# Patient Record
Sex: Female | Born: 1970 | Race: Black or African American | Hispanic: No | Marital: Married | Smoking: Never smoker
Health system: Southern US, Community
[De-identification: ages and names within clinical notes are randomized; demographics above are authoritative.]

## PROBLEM LIST (undated history)

## (undated) ENCOUNTER — Emergency Department (HOSPITAL_COMMUNITY): Admission: EM | Payer: Managed Care, Other (non HMO) | Source: Home / Self Care

## (undated) DIAGNOSIS — Z9889 Other specified postprocedural states: Secondary | ICD-10-CM

## (undated) DIAGNOSIS — R112 Nausea with vomiting, unspecified: Secondary | ICD-10-CM

## (undated) DIAGNOSIS — T8859XA Other complications of anesthesia, initial encounter: Secondary | ICD-10-CM

## (undated) DIAGNOSIS — D242 Benign neoplasm of left breast: Secondary | ICD-10-CM

## (undated) DIAGNOSIS — T4145XA Adverse effect of unspecified anesthetic, initial encounter: Secondary | ICD-10-CM

## (undated) HISTORY — PX: KNEE ARTHROSCOPY: SUR90

## (undated) HISTORY — PX: TUBAL LIGATION: SHX77

## (undated) HISTORY — PX: DILATION AND CURETTAGE OF UTERUS: SHX78

## (undated) HISTORY — PX: BREAST BIOPSY: SHX20

---

## 1999-07-30 ENCOUNTER — Other Ambulatory Visit: Admission: RE | Admit: 1999-07-30 | Discharge: 1999-07-30 | Payer: Self-pay | Admitting: Gynecology

## 1999-10-02 ENCOUNTER — Encounter: Payer: Self-pay | Admitting: Obstetrics and Gynecology

## 1999-10-02 ENCOUNTER — Encounter: Admission: RE | Admit: 1999-10-02 | Discharge: 1999-10-02 | Payer: Self-pay | Admitting: Obstetrics and Gynecology

## 1999-11-01 ENCOUNTER — Ambulatory Visit (HOSPITAL_BASED_OUTPATIENT_CLINIC_OR_DEPARTMENT_OTHER): Admission: RE | Admit: 1999-11-01 | Discharge: 1999-11-01 | Payer: Self-pay | Admitting: General Surgery

## 2000-03-31 ENCOUNTER — Encounter: Admission: RE | Admit: 2000-03-31 | Discharge: 2000-03-31 | Payer: Self-pay | Admitting: General Surgery

## 2000-03-31 ENCOUNTER — Encounter: Payer: Self-pay | Admitting: General Surgery

## 2002-04-15 ENCOUNTER — Encounter: Admission: RE | Admit: 2002-04-15 | Discharge: 2002-04-15 | Payer: Self-pay | Admitting: Obstetrics and Gynecology

## 2002-04-15 ENCOUNTER — Encounter: Payer: Self-pay | Admitting: Obstetrics and Gynecology

## 2002-09-06 ENCOUNTER — Other Ambulatory Visit: Admission: RE | Admit: 2002-09-06 | Discharge: 2002-09-06 | Payer: Self-pay | Admitting: Obstetrics and Gynecology

## 2004-01-05 ENCOUNTER — Other Ambulatory Visit: Admission: RE | Admit: 2004-01-05 | Discharge: 2004-01-05 | Payer: Self-pay | Admitting: Obstetrics and Gynecology

## 2008-05-22 ENCOUNTER — Encounter: Payer: Self-pay | Admitting: Emergency Medicine

## 2008-06-01 ENCOUNTER — Ambulatory Visit: Payer: Self-pay | Admitting: Cardiovascular Disease

## 2008-06-01 ENCOUNTER — Ambulatory Visit: Payer: Self-pay | Admitting: Emergency Medicine

## 2008-06-01 DIAGNOSIS — R0789 Other chest pain: Secondary | ICD-10-CM

## 2008-06-01 DIAGNOSIS — N644 Mastodynia: Secondary | ICD-10-CM

## 2008-06-06 ENCOUNTER — Encounter: Admission: RE | Admit: 2008-06-06 | Discharge: 2008-06-06 | Payer: Self-pay | Admitting: Obstetrics and Gynecology

## 2008-06-07 ENCOUNTER — Telehealth (INDEPENDENT_AMBULATORY_CARE_PROVIDER_SITE_OTHER): Payer: Self-pay | Admitting: *Deleted

## 2008-07-04 ENCOUNTER — Ambulatory Visit: Payer: Self-pay | Admitting: Emergency Medicine

## 2008-07-11 ENCOUNTER — Ambulatory Visit: Payer: Self-pay | Admitting: Emergency Medicine

## 2008-08-02 ENCOUNTER — Encounter: Payer: Self-pay | Admitting: Emergency Medicine

## 2009-11-08 ENCOUNTER — Telehealth (INDEPENDENT_AMBULATORY_CARE_PROVIDER_SITE_OTHER): Payer: Self-pay | Admitting: *Deleted

## 2010-02-18 ENCOUNTER — Encounter: Payer: Self-pay | Admitting: Obstetrics and Gynecology

## 2010-02-27 NOTE — Progress Notes (Signed)
  Phone Note Other Incoming   Request: Send information Summary of Call: Request for records received from [facility]. Request forwarded to [facility] for all Pulmonary records to be copied and sent.

## 2010-06-15 NOTE — H&P (Signed)
[place]  Patient:    Bianca Bass, Bianca Bass                      MRN: [id number] Adm. Date:  [id number] Disc. Date: [id number] Attending:  Cherylynn Ridges Dictator:   Jimmye Norman, M.D.                         History and Physical  CHIEF COMPLAINT:  The patient is a 40 year old female with a right breast mass.  HISTORY OF PRESENT ILLNESS:  She came to see me on October 23, 1999, with a breast mass noted by herself and also by her primary care physician and OB/GYN who on examination saw that laterally in her right breast there was a mobile mass.  Upon evaluation by ultrasound, this was thought to be likely a fibroadenoma and a surgical consultation was obtained.  PAST MEDICAL HISTORY:  Her past medical history is unremarkable.  She has had a previous breast biopsy x 4 all of which have been benign, and she has had a breast biopsy on the right side before.  She has no history of breast cancer herself.  She has no history of breast cancer in her family.  She started her menstrual cycle at age 39 and is currently having normal periods.  She had a tubal ligation in 1995.  MEDICATIONS:  She takes no medications.  ALLERGIES:  She has no known drug allergies.  PHYSICAL EXAMINATION:  GENERAL:  She is otherwise healthy.  BREAST EXAMINATION:  She has normal contour of the breasts without inversions or dimpling.  She has obvious scars from her previous biopsies.  She has no nipple inversion or drainage.  She has slight enlargement of the left axillary fat pad, a possible lymph node, but this does not feel pathologic.  There is no palpable node on the right in the axilla.  In the outer quadrant of the right breast at approximately 8-9 oclock position there is a very mobile easily palpable mass of approximately 1.5 cm in size which is nontender and it feels like a fibroadenoma.  She has small nodularity throughout both breasts, but none is prominent as the  one mentioned previously on the right side. There is nothing alarming about this mass, it is freely mobile and smooth and very likely it is a fibroadenoma, however, to show this definitely an open biopsy has been planned.  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  PLAN:  My plan is to perform an open breast biopsy and this will be done as an outpatient. DD:  11/01/99 TD:  11/01/99 Job: [id number]

## 2010-06-15 NOTE — Op Note (Signed)
[place]  Patient:    Bianca Bass, Bianca Bass                      MRN: [id number] Proc. Date: 11/01/99 Adm. Date:  [id number] Disc. Date: [id number] Attending:  Cherylynn Ridges                           Operative Report  PREOPERATIVE DIAGNOSIS:  Fibroadenoma of the right breast in the lateral position.  POSTOPERATIVE DIAGNOSIS:  Fibroadenoma of the right breast in the lateral position.  OPERATION:  Right breast biopsy.  SURGEON:  Jimmye Norman, M.D.  ASSISTANT:  None.  ANESTHESIA:  Monitored anesthesia care with 1% Xylocaine local and 0.25% Marcaine local anesthetic.  COMPLICATIONS:  None.  CONDITION:  Stable.  FINDINGS:  A 2.0 cm bilobulated fibroadenoma of the right breast near the chest wall.  DESCRIPTION OF PROCEDURE:  The patient was taken to the operating room and placed on the table in supine position.  After an adequate general anesthetic was administered, she was prepped and draped in the usual sterile manner exposing the right breast.  After receiving an appropriate amount of sedation, the area directly overlying the palpable fibroadenoma was anesthetized using 1% Xylocaine without epinephrine.  We used a #15 blade to incise into the skin and subcutaneous tissue.  We palpated the mass and were able to retract some of the subcutaneous tissue up to the skin and excised that, sending it as a separate specimen on top of the fibroadenoma.  The fibroadenoma seemed to be deeper into the breast tissue underneath a separate fascia layer.  This was dissected out and sent as a separate specimen with minimal difficulty.  Once the fibroadenoma was removed, adequate hemostasis was obtained using electrocautery.  We then closed in two layers, a deep 4-0 Vicryl subcutaneous layer, and the skin closed using a running subcuticular stitch of 5-0 Vicryl. Marcaine 0.25% with epinephrine was injected to the subcutaneous tissue and the breast substance  itself and also allowed to fill the fibroadenoma cavity. All needle counts, sponge counts, and instrument counts were correct.  Sterile dressing was applied to the wound. DD:  11/01/99 TD:  11/02/99 Job: [id number]

## 2010-09-24 ENCOUNTER — Other Ambulatory Visit: Payer: Self-pay | Admitting: Obstetrics and Gynecology

## 2010-09-24 DIAGNOSIS — N644 Mastodynia: Secondary | ICD-10-CM

## 2010-09-24 DIAGNOSIS — N63 Unspecified lump in unspecified breast: Secondary | ICD-10-CM

## 2010-10-08 ENCOUNTER — Other Ambulatory Visit: Payer: Self-pay | Admitting: Obstetrics and Gynecology

## 2010-10-08 ENCOUNTER — Ambulatory Visit
Admission: RE | Admit: 2010-10-08 | Discharge: 2010-10-08 | Disposition: A | Discharge status: Home / Self Care | Payer: PRIVATE HEALTH INSURANCE | Source: Ambulatory Visit | Attending: Obstetrics and Gynecology | Admitting: Obstetrics and Gynecology

## 2010-10-08 DIAGNOSIS — N63 Unspecified lump in unspecified breast: Secondary | ICD-10-CM

## 2010-10-08 DIAGNOSIS — N644 Mastodynia: Secondary | ICD-10-CM

## 2011-06-03 ENCOUNTER — Other Ambulatory Visit: Payer: Self-pay | Admitting: Obstetrics and Gynecology

## 2011-07-29 ENCOUNTER — Other Ambulatory Visit: Payer: Self-pay | Admitting: Family Medicine

## 2011-07-29 DIAGNOSIS — N644 Mastodynia: Secondary | ICD-10-CM

## 2011-08-14 ENCOUNTER — Ambulatory Visit
Admission: RE | Admit: 2011-08-14 | Discharge: 2011-08-14 | Disposition: A | Discharge status: Home / Self Care | Payer: PRIVATE HEALTH INSURANCE | Source: Ambulatory Visit | Attending: Family Medicine | Admitting: Family Medicine

## 2011-08-14 DIAGNOSIS — N644 Mastodynia: Secondary | ICD-10-CM

## 2014-01-28 HISTORY — PX: ENDOMETRIAL ABLATION: SHX621

## 2014-12-02 ENCOUNTER — Other Ambulatory Visit: Payer: Self-pay

## 2014-12-02 DIAGNOSIS — Z1231 Encounter for screening mammogram for malignant neoplasm of breast: Secondary | ICD-10-CM

## 2015-01-16 ENCOUNTER — Ambulatory Visit: Payer: PRIVATE HEALTH INSURANCE

## 2016-02-23 ENCOUNTER — Other Ambulatory Visit: Payer: Self-pay | Admitting: Obstetrics and Gynecology

## 2016-02-23 DIAGNOSIS — Z1231 Encounter for screening mammogram for malignant neoplasm of breast: Secondary | ICD-10-CM

## 2016-03-04 ENCOUNTER — Ambulatory Visit
Admission: RE | Admit: 2016-03-04 | Discharge: 2016-03-04 | Disposition: A | Discharge status: Home / Self Care | Payer: Managed Care, Other (non HMO) | Source: Ambulatory Visit | Attending: Obstetrics and Gynecology | Admitting: Obstetrics and Gynecology

## 2016-03-04 DIAGNOSIS — Z1231 Encounter for screening mammogram for malignant neoplasm of breast: Secondary | ICD-10-CM

## 2016-03-06 ENCOUNTER — Other Ambulatory Visit: Payer: Self-pay | Admitting: Obstetrics and Gynecology

## 2016-03-06 DIAGNOSIS — R928 Other abnormal and inconclusive findings on diagnostic imaging of breast: Secondary | ICD-10-CM

## 2016-03-11 ENCOUNTER — Ambulatory Visit
Admission: RE | Admit: 2016-03-11 | Discharge: 2016-03-11 | Disposition: A | Discharge status: Home / Self Care | Payer: Managed Care, Other (non HMO) | Source: Ambulatory Visit | Attending: Obstetrics and Gynecology | Admitting: Obstetrics and Gynecology

## 2016-03-11 DIAGNOSIS — R928 Other abnormal and inconclusive findings on diagnostic imaging of breast: Secondary | ICD-10-CM

## 2016-03-15 ENCOUNTER — Other Ambulatory Visit: Payer: Self-pay | Admitting: General Surgery

## 2016-03-15 DIAGNOSIS — D242 Benign neoplasm of left breast: Secondary | ICD-10-CM

## 2016-05-01 ENCOUNTER — Other Ambulatory Visit: Payer: Self-pay | Admitting: General Surgery

## 2016-05-01 DIAGNOSIS — D242 Benign neoplasm of left breast: Secondary | ICD-10-CM

## 2016-05-08 ENCOUNTER — Encounter (HOSPITAL_BASED_OUTPATIENT_CLINIC_OR_DEPARTMENT_OTHER): Payer: Self-pay | Admitting: *Deleted

## 2016-05-13 ENCOUNTER — Ambulatory Visit
Admission: RE | Admit: 2016-05-13 | Discharge: 2016-05-13 | Disposition: A | Discharge status: Home / Self Care | Payer: Managed Care, Other (non HMO) | Source: Ambulatory Visit | Attending: General Surgery | Admitting: General Surgery

## 2016-05-13 ENCOUNTER — Other Ambulatory Visit: Payer: Self-pay | Admitting: General Surgery

## 2016-05-13 DIAGNOSIS — D242 Benign neoplasm of left breast: Secondary | ICD-10-CM

## 2016-05-13 NOTE — Progress Notes (Signed)
Boost drink given with instructions to complete by 0730, pt verbalized understanding. 

## 2016-05-16 NOTE — H&P (Signed)
[place] Location: [facility] Surgery Patient #: [id number] DOB: 08-06-1970 Married / Language: English / Race: Black or African American Female       History of Present Illness       This is a pleasant 46 year old African American female, referred by Dr. Lovey Newcomer of the procedure and agrees were evaluation of an enlarging fibroadenoma of the left breast upper outer quadrant. She is healthy and does not have a PCP      She has had fibroadenomas excised in the past. 3 on the left side and one on the right. In 2013 she had image guided biopsy of a small fibroadenoma in the upper outer quadrant of the left breast. Marker clip was placed. Recent screening mammogram shows that this has enlarged from 1.1-1.4 cm. Lobular mass. 1 o'clock position left breast. 6 cm from nipple. She is not sure she can feel this but says it is a little bit tender there. Also noted were 3 stable benign right breast masses which are hypoechoic.  She is inclined to have the left breast mass excised as well.      Past history significant for right breast biopsy 1 and left breast biopsy 3 since she was a teenager. She says she thinks all these were fibroadenomas. Otherwise healthy other than BMI 36. Has no PCP Family history is negative for breast or ovarian cancer. Mother living has multiple sclerosis. Father living has diabetes and hypertension. Social history reveals she lives in [place]. Is married with 1 child. Works as a Theme park manager. Denies alcohol or tobacco.      We discussed this for a long time. She is inclined to have this excised as well. I told her that the risk of this being cancer is pretty low and it is probably just a slowly enlarging fibroadenoma. She'll be scheduled for left breast lumpectomy with radioactive seed localization I discussed the indications, details, techniques, and numerous risk of the surgery with her. She is aware of the risk of bleeding, infection,  cosmetic deformity, nerve damage and chronic pain, reoperation of cancer, and other unforeseen problems. She understands these issues well. All of her questions were answered. She agrees with this plan.      I printed out some information about fibroadenomas from [facility] and gave it to her   Past Surgical History  Breast Mass; Local Excision  Bilateral. Knee Surgery  Bilateral.  Diagnostic Studies History  Colonoscopy  never Mammogram  within last year Pap Smear  1-5 years ago  Allergies  No Known Allergies 03/15/2016  Medication History  No Current Medications Medications Reconciled  Social History  Caffeine use  Carbonated beverages, Coffee.  Family History  Diabetes Mellitus  Father. Hypertension  Father.  Pregnancy / Birth History  Age at menarche  51 years. Gravida  2 Irregular periods  Maternal age  75-25 Para  60  Other Problems  General anesthesia - complications     Review of Systems  General Not Present- Appetite Loss, Chills, Fatigue, Fever, Night Sweats, Weight Gain and Weight Loss. Skin Not Present- Change in Wart/Mole, Dryness, Hives, Jaundice, New Lesions, Non-Healing Wounds, Rash and Ulcer. HEENT Not Present- Earache, Hearing Loss, Hoarseness, Nose Bleed, Oral Ulcers, Ringing in the Ears, Seasonal Allergies, Sinus Pain, Sore Throat, Visual Disturbances, Wears glasses/contact lenses and Yellow Eyes. Breast Present- Breast Mass. Not Present- Breast Pain, Nipple Discharge and Skin Changes. Cardiovascular Not Present- Chest Pain, Difficulty Breathing Lying Down, Leg Cramps, Palpitations, Rapid Heart Rate,  Shortness of Breath and Swelling of Extremities. Gastrointestinal Not Present- Abdominal Pain, Bloating, Bloody Stool, Change in Bowel Habits, Chronic diarrhea, Constipation, Difficulty Swallowing, Excessive gas, Gets full quickly at meals, Hemorrhoids, Indigestion, Nausea, Rectal Pain and Vomiting. Female Genitourinary Not Present-  Frequency, Nocturia, Painful Urination, Pelvic Pain and Urgency. Musculoskeletal Not Present- Back Pain, Joint Pain, Joint Stiffness, Muscle Pain, Muscle Weakness and Swelling of Extremities. Neurological Not Present- Decreased Memory, Fainting, Headaches, Numbness, Seizures, Tingling, Tremor, Trouble walking and Weakness. Psychiatric Not Present- Anxiety, Bipolar, Change in Sleep Pattern, Depression, Fearful and Frequent crying. Endocrine Not Present- Cold Intolerance, Excessive Hunger, Hair Changes, Heat Intolerance, Hot flashes and New Diabetes.  Vitals  Weight: 241 lb Height: 68.5in Body Surface Area: 2.22 m Body Mass Index: 36.11 kg/m  Temp.: 98.87F  Pulse: 93 (Regular)  BP: 118/80 (Sitting, Left Arm, Standard)     Physical Exam  General Mental Status-Alert. General Appearance-Consistent with stated age. Hydration-Well hydrated. Voice-Normal. Note: Very pleasant. Cooperative. Mental status normal. BMI 36   Head and Neck Head-normocephalic, atraumatic with no lesions or palpable masses. Trachea-midline. Thyroid Gland Characteristics - normal size and consistency.  Eye Eyeball - Bilateral-Extraocular movements intact. Sclera/Conjunctiva - Bilateral-No scleral icterus.  Chest and Lung Exam Chest and lung exam reveals -quiet, even and easy respiratory effort with no use of accessory muscles and on auscultation, normal breath sounds, no adventitious sounds and normal vocal resonance. Inspection Chest Wall - Normal. Back - normal.  Breast Note: Breasts are large and symmetrical. There is a curvilinear scar in the right breast at the 9 o'clock position well healed. There are curvilinear biopsy scars in the left breast at the 7 o'clock position, medial circumareolar, and upper outer quadrant. I may feel a little bit of thickening at the 1 o'clock position but not a discrete mass. There is no axillary adenopathy on either side. Skin is otherwise  healthy.   Cardiovascular Cardiovascular examination reveals -normal heart sounds, regular rate and rhythm with no murmurs and normal pedal pulses bilaterally.  Abdomen Inspection Inspection of the abdomen reveals - No Hernias. Skin - Scar - no surgical scars. Palpation/Percussion Palpation and Percussion of the abdomen reveal - Soft, Non Tender, No Rebound tenderness, No Rigidity (guarding) and No hepatosplenomegaly. Auscultation Auscultation of the abdomen reveals - Bowel sounds normal.  Neurologic Neurologic evaluation reveals -alert and oriented x 3 with no impairment of recent or remote memory. Mental Status-Normal.  Musculoskeletal Normal Exam - Left-Upper Extremity Strength Normal and Lower Extremity Strength Normal. Normal Exam - Right-Upper Extremity Strength Normal and Lower Extremity Strength Normal.  Lymphatic Head & Neck  General Head & Neck Lymphatics: Bilateral - Description - Normal. Axillary  General Axillary Region: Bilateral - Description - Normal. Tenderness - Non Tender. Femoral & Inguinal  Generalized Femoral & Inguinal Lymphatics: Bilateral - Description - Normal. Tenderness - Non Tender.    Assessment & Plan  BREAST FIBROADENOMA, LEFT (D24.2)    Your recent imaging studies showed that a previously biopsied fibroadenoma in the upper outer quadrant of the left breast has enlarged somewhat This is most likely not cancer However, because of the enlargement, the recommended standard of care is conservative excision We have discussed the indications, techniques, and risks of this surgery in detail You have agreed to have this done electively in the next few weeks  you will be scheduled for left breast lumpectomy with radioactive seed localization  BMI 36.0-36.9,ADULT (Z68.36)    Edsel Petrin. Dalbert Batman, M.D., [facility] General and  Minimally invasive Surgery Breast and Colorectal Surgery Office:    [phone] Pager:   [phone]

## 2016-05-17 ENCOUNTER — Ambulatory Visit (HOSPITAL_BASED_OUTPATIENT_CLINIC_OR_DEPARTMENT_OTHER): Payer: Managed Care, Other (non HMO) | Admitting: Anesthesiology

## 2016-05-17 ENCOUNTER — Encounter (HOSPITAL_BASED_OUTPATIENT_CLINIC_OR_DEPARTMENT_OTHER): Payer: Self-pay | Admitting: *Deleted

## 2016-05-17 ENCOUNTER — Ambulatory Visit
Admission: RE | Admit: 2016-05-17 | Discharge: 2016-05-17 | Disposition: A | Discharge status: Home / Self Care | Payer: Managed Care, Other (non HMO) | Source: Ambulatory Visit | Attending: General Surgery | Admitting: General Surgery

## 2016-05-17 ENCOUNTER — Encounter (HOSPITAL_BASED_OUTPATIENT_CLINIC_OR_DEPARTMENT_OTHER)
Admission: RE | Disposition: A | Discharge status: Home / Self Care | Payer: Self-pay | Source: Ambulatory Visit | Attending: General Surgery

## 2016-05-17 ENCOUNTER — Ambulatory Visit (HOSPITAL_BASED_OUTPATIENT_CLINIC_OR_DEPARTMENT_OTHER)
Admission: RE | Admit: 2016-05-17 | Discharge: 2016-05-17 | Disposition: A | Discharge status: Home / Self Care | Payer: Managed Care, Other (non HMO) | Source: Ambulatory Visit | Attending: General Surgery | Admitting: General Surgery

## 2016-05-17 DIAGNOSIS — D242 Benign neoplasm of left breast: Secondary | ICD-10-CM | POA: Diagnosis not present

## 2016-05-17 DIAGNOSIS — Z6837 Body mass index (BMI) 37.0-37.9, adult: Secondary | ICD-10-CM | POA: Insufficient documentation

## 2016-05-17 DIAGNOSIS — N6320 Unspecified lump in the left breast, unspecified quadrant: Secondary | ICD-10-CM | POA: Diagnosis present

## 2016-05-17 DIAGNOSIS — E669 Obesity, unspecified: Secondary | ICD-10-CM | POA: Insufficient documentation

## 2016-05-17 HISTORY — DX: Benign neoplasm of left breast: D24.2

## 2016-05-17 HISTORY — PX: BREAST LUMPECTOMY WITH RADIOACTIVE SEED LOCALIZATION: SHX6424

## 2016-05-17 HISTORY — DX: Other specified postprocedural states: Z98.890

## 2016-05-17 HISTORY — DX: Adverse effect of unspecified anesthetic, initial encounter: T41.45XA

## 2016-05-17 HISTORY — DX: Nausea with vomiting, unspecified: R11.2

## 2016-05-17 HISTORY — DX: Other complications of anesthesia, initial encounter: T88.59XA

## 2016-05-17 HISTORY — PX: BREAST EXCISIONAL BIOPSY: SUR124

## 2016-05-17 SURGERY — BREAST LUMPECTOMY WITH RADIOACTIVE SEED LOCALIZATION
Anesthesia: General | Site: Breast | Laterality: Left

## 2016-05-17 MED ORDER — 0.9 % SODIUM CHLORIDE (POUR BTL) OPTIME
TOPICAL | Status: DC | PRN
  Administered 2016-05-17: 1000 mL

## 2016-05-17 MED ORDER — SODIUM CHLORIDE 0.9 % IJ SOLN
INTRAMUSCULAR | Status: AC
  Filled 2016-05-17: qty 10

## 2016-05-17 MED ORDER — CELECOXIB 200 MG PO CAPS
ORAL_CAPSULE | ORAL | Status: AC
  Filled 2016-05-17: qty 2

## 2016-05-17 MED ORDER — FENTANYL CITRATE (PF) 100 MCG/2ML IJ SOLN
INTRAMUSCULAR | Status: AC
Start: 2016-05-17 — End: 2016-05-17
  Filled 2016-05-17: qty 2

## 2016-05-17 MED ORDER — BUPIVACAINE HCL (PF) 0.5 % IJ SOLN
INTRAMUSCULAR | Status: DC | PRN
  Administered 2016-05-17: 10 mL

## 2016-05-17 MED ORDER — SCOPOLAMINE 1 MG/3DAYS TD PT72
MEDICATED_PATCH | TRANSDERMAL | Status: AC
  Filled 2016-05-17: qty 1

## 2016-05-17 MED ORDER — CHLORHEXIDINE GLUCONATE CLOTH 2 % EX PADS: 6.0000 | MEDICATED_PAD | Freq: Once | CUTANEOUS | Status: DC

## 2016-05-17 MED ORDER — PHENYLEPHRINE 40 MCG/ML (10ML) SYRINGE FOR IV PUSH (FOR BLOOD PRESSURE SUPPORT)
PREFILLED_SYRINGE | INTRAVENOUS | Status: AC
  Filled 2016-05-17: qty 20

## 2016-05-17 MED ORDER — PROPOFOL 500 MG/50ML IV EMUL
INTRAVENOUS | Status: DC | PRN
  Administered 2016-05-17: 150 mL via INTRAVENOUS

## 2016-05-17 MED ORDER — GABAPENTIN 300 MG PO CAPS
ORAL_CAPSULE | ORAL | Status: AC
  Filled 2016-05-17: qty 1

## 2016-05-17 MED ORDER — GABAPENTIN 300 MG PO CAPS
300.0000 mg | ORAL_CAPSULE | ORAL | Status: AC
  Administered 2016-05-17: 300 mg via ORAL

## 2016-05-17 MED ORDER — METHYLENE BLUE 0.5 % INJ SOLN
INTRAVENOUS | Status: AC
  Filled 2016-05-17: qty 10

## 2016-05-17 MED ORDER — MIDAZOLAM HCL 2 MG/2ML IJ SOLN
INTRAMUSCULAR | Status: AC
  Filled 2016-05-17: qty 2

## 2016-05-17 MED ORDER — FENTANYL CITRATE (PF) 100 MCG/2ML IJ SOLN
50.0000 ug | INTRAMUSCULAR | Status: DC | PRN
  Administered 2016-05-17: 50 ug via INTRAVENOUS

## 2016-05-17 MED ORDER — CEFAZOLIN SODIUM-DEXTROSE 2-4 GM/100ML-% IV SOLN
2.0000 g | INTRAVENOUS | Status: AC
  Administered 2016-05-17: 2 g via INTRAVENOUS

## 2016-05-17 MED ORDER — SCOPOLAMINE 1 MG/3DAYS TD PT72
1.0000 | MEDICATED_PATCH | Freq: Once | TRANSDERMAL | Status: DC | PRN
  Administered 2016-05-17: 1.5 mg via TRANSDERMAL

## 2016-05-17 MED ORDER — ONDANSETRON HCL 4 MG/2ML IJ SOLN
INTRAMUSCULAR | Status: AC
  Filled 2016-05-17: qty 2

## 2016-05-17 MED ORDER — ONDANSETRON HCL 4 MG/2ML IJ SOLN
INTRAMUSCULAR | Status: DC | PRN
  Administered 2016-05-17: 4 mg via INTRAVENOUS

## 2016-05-17 MED ORDER — CELECOXIB 400 MG PO CAPS
400.0000 mg | ORAL_CAPSULE | ORAL | Status: AC
  Administered 2016-05-17: 400 mg via ORAL

## 2016-05-17 MED ORDER — LIDOCAINE 2% (20 MG/ML) 5 ML SYRINGE
INTRAMUSCULAR | Status: DC | PRN
  Administered 2016-05-17: 80 mg via INTRAVENOUS

## 2016-05-17 MED ORDER — LIDOCAINE 2% (20 MG/ML) 5 ML SYRINGE
INTRAMUSCULAR | Status: AC
  Filled 2016-05-17: qty 5

## 2016-05-17 MED ORDER — PHENYLEPHRINE HCL 10 MG/ML IJ SOLN
INTRAMUSCULAR | Status: DC | PRN
  Administered 2016-05-17: 40 ug via INTRAVENOUS
  Administered 2016-05-17: 80 ug via INTRAVENOUS

## 2016-05-17 MED ORDER — ACETAMINOPHEN 500 MG PO TABS
1000.0000 mg | ORAL_TABLET | ORAL | Status: AC
  Administered 2016-05-17: 1000 mg via ORAL

## 2016-05-17 MED ORDER — DEXAMETHASONE SODIUM PHOSPHATE 10 MG/ML IJ SOLN
INTRAMUSCULAR | Status: DC | PRN
  Administered 2016-05-17: 10 mg via INTRAVENOUS

## 2016-05-17 MED ORDER — FENTANYL CITRATE (PF) 100 MCG/2ML IJ SOLN
INTRAMUSCULAR | Status: AC
  Filled 2016-05-17: qty 2

## 2016-05-17 MED ORDER — LACTATED RINGERS IV SOLN
INTRAVENOUS | Status: DC
  Administered 2016-05-17: 10:00:00 via INTRAVENOUS

## 2016-05-17 MED ORDER — FENTANYL CITRATE (PF) 100 MCG/2ML IJ SOLN
25.0000 ug | INTRAMUSCULAR | Status: DC | PRN
  Administered 2016-05-17: 25 ug via INTRAVENOUS

## 2016-05-17 MED ORDER — ACETAMINOPHEN 500 MG PO TABS
ORAL_TABLET | ORAL | Status: AC
  Filled 2016-05-17: qty 2

## 2016-05-17 MED ORDER — CEFAZOLIN SODIUM-DEXTROSE 2-4 GM/100ML-% IV SOLN
INTRAVENOUS | Status: AC
  Filled 2016-05-17: qty 100

## 2016-05-17 MED ORDER — PROMETHAZINE HCL 25 MG/ML IJ SOLN: 6.2500 mg | INTRAMUSCULAR | Status: DC | PRN

## 2016-05-17 MED ORDER — MIDAZOLAM HCL 2 MG/2ML IJ SOLN
1.0000 mg | INTRAMUSCULAR | Status: DC | PRN
  Administered 2016-05-17: 1 mg via INTRAVENOUS

## 2016-05-17 SURGICAL SUPPLY — 50 items
ADH SKN CLS APL DERMABOND .7 (GAUZE/BANDAGES/DRESSINGS) ×1
APPLIER CLIP 9.375 MED OPEN (MISCELLANEOUS) ×3
APR CLP MED 9.3 20 MLT OPN (MISCELLANEOUS) ×1
BINDER BREAST XLRG (GAUZE/BANDAGES/DRESSINGS) ×2 IMPLANT
BLADE HEX COATED 2.75 (ELECTRODE) ×3 IMPLANT
BLADE SURG 15 STRL LF DISP TIS (BLADE) ×1 IMPLANT
BLADE SURG 15 STRL SS (BLADE) ×3
CANISTER SUCT 1200ML W/VALVE (MISCELLANEOUS) ×3 IMPLANT
CHLORAPREP W/TINT 26ML (MISCELLANEOUS) ×3 IMPLANT
CLIP APPLIE 9.375 MED OPEN (MISCELLANEOUS) ×1 IMPLANT
COVER BACK TABLE 60X90IN (DRAPES) ×3 IMPLANT
COVER MAYO STAND STRL (DRAPES) ×3 IMPLANT
COVER PROBE W GEL 5X96 (DRAPES) ×3 IMPLANT
DECANTER SPIKE VIAL GLASS SM (MISCELLANEOUS) ×2 IMPLANT
DERMABOND ADVANCED (GAUZE/BANDAGES/DRESSINGS) ×2
DERMABOND ADVANCED .7 DNX12 (GAUZE/BANDAGES/DRESSINGS) ×1 IMPLANT
DEVICE DUBIN W/COMP PLATE 8390 (MISCELLANEOUS) ×3 IMPLANT
DRAPE LAPAROSCOPIC ABDOMINAL (DRAPES) ×3 IMPLANT
DRAPE UTILITY XL STRL (DRAPES) ×3 IMPLANT
DRSG PAD ABDOMINAL 8X10 ST (GAUZE/BANDAGES/DRESSINGS) ×2 IMPLANT
ELECT REM PT RETURN 9FT ADLT (ELECTROSURGICAL) ×3
ELECTRODE REM PT RTRN 9FT ADLT (ELECTROSURGICAL) ×1 IMPLANT
GAUZE SPONGE 4X4 12PLY STRL (GAUZE/BANDAGES/DRESSINGS) ×2 IMPLANT
GLOVE BIOGEL PI IND STRL 7.0 (GLOVE) IMPLANT
GLOVE BIOGEL PI IND STRL 7.5 (GLOVE) IMPLANT
GLOVE BIOGEL PI INDICATOR 7.0 (GLOVE) ×2
GLOVE BIOGEL PI INDICATOR 7.5 (GLOVE) ×2
GLOVE EUDERMIC 7 POWDERFREE (GLOVE) ×3 IMPLANT
GLOVE EXAM NITRILE MD LF STRL (GLOVE) ×2 IMPLANT
GLOVE SURG SS PI 7.0 STRL IVOR (GLOVE) ×2 IMPLANT
GOWN STRL REUS W/ TWL LRG LVL3 (GOWN DISPOSABLE) ×1 IMPLANT
GOWN STRL REUS W/ TWL XL LVL3 (GOWN DISPOSABLE) ×1 IMPLANT
GOWN STRL REUS W/TWL LRG LVL3 (GOWN DISPOSABLE) ×3
GOWN STRL REUS W/TWL XL LVL3 (GOWN DISPOSABLE) ×6
KIT MARKER MARGIN INK (KITS) ×3 IMPLANT
NEEDLE HYPO 25X1 1.5 SAFETY (NEEDLE) ×3 IMPLANT
NS IRRIG 1000ML POUR BTL (IV SOLUTION) ×3 IMPLANT
PACK BASIN DAY SURGERY FS (CUSTOM PROCEDURE TRAY) ×3 IMPLANT
PENCIL BUTTON HOLSTER BLD 10FT (ELECTRODE) ×3 IMPLANT
SLEEVE SCD COMPRESS KNEE MED (MISCELLANEOUS) ×3 IMPLANT
SPONGE LAP 4X18 X RAY DECT (DISPOSABLE) ×3 IMPLANT
SUT MNCRL AB 4-0 PS2 18 (SUTURE) ×3 IMPLANT
SUT SILK 2 0 SH (SUTURE) ×3 IMPLANT
SUT VICRYL 3-0 CR8 SH (SUTURE) ×3 IMPLANT
SYR 10ML LL (SYRINGE) ×3 IMPLANT
TOWEL OR 17X24 6PK STRL BLUE (TOWEL DISPOSABLE) ×3 IMPLANT
TOWEL OR NON WOVEN STRL DISP B (DISPOSABLE) ×2 IMPLANT
TUBE CONNECTING 20'X1/4 (TUBING) ×1
TUBE CONNECTING 20X1/4 (TUBING) ×2 IMPLANT
YANKAUER SUCT BULB TIP NO VENT (SUCTIONS) ×3 IMPLANT

## 2016-05-17 NOTE — Anesthesia Preprocedure Evaluation (Addendum)
Anesthesia Evaluation  Patient identified by MRN, date of birth, ID band Patient awake    Reviewed: Allergy & Precautions, NPO status , Patient's Chart, lab work & pertinent test results  History of Anesthesia Complications (+) PONV and history of anesthetic complications  Airway Mallampati: II  TM Distance: >3 FB Neck ROM: Full  Mouth opening: Limited Mouth Opening  Dental  (+) Teeth Intact, Dental Advisory Given   Pulmonary neg pulmonary ROS,    Pulmonary exam normal breath sounds clear to auscultation       Cardiovascular Exercise Tolerance: Good negative cardio ROS Normal cardiovascular exam Rhythm:Regular Rate:Normal     Neuro/Psych negative neurological ROS     GI/Hepatic negative GI ROS, Neg liver ROS,   Endo/Other  Obesity   Renal/GU negative Renal ROS     Musculoskeletal negative musculoskeletal ROS (+)   Abdominal   Peds  Hematology negative hematology ROS (+)   Anesthesia Other Findings Day of surgery medications reviewed with the patient.  LEFT BREAST FIBROADENOMA  Reproductive/Obstetrics                            Anesthesia Physical Anesthesia Plan  ASA: II  Anesthesia Plan: General   Post-op Pain Management:    Induction: Intravenous  Airway Management Planned: LMA  Additional Equipment:   Intra-op Plan:   Post-operative Plan: Extubation in OR  Informed Consent: I have reviewed the patients History and Physical, chart, labs and discussed the procedure including the risks, benefits and alternatives for the proposed anesthesia with the patient or authorized representative who has indicated his/her understanding and acceptance.   Dental advisory given  Plan Discussed with: CRNA  Anesthesia Plan Comments:         Anesthesia Quick Evaluation

## 2016-05-17 NOTE — Op Note (Signed)
Patient Name:           Bianca Bass   Date of Surgery:        05/17/2016  Pre op Diagnosis:      Enlarging mass left breast, suspect fibroadenoma  Post op Diagnosis:    Same  Procedure:                 Left breast lumpectomy with radioactive seed localization  Surgeon:                     Edsel Petrin. Dalbert Batman, M.D., FACS  Assistant:                      OR staff   Indication for Assistant: n/a  Operative Indications:    This is a pleasant 46 year old [place] American female, referred by Dr. Lovey Newcomer of the [facility] for  evaluation of an enlarging fibroadenoma of the left breast upper outer quadrant.       She has had fibroadenomas excised in the past. 3 on the left side and one on the right. In 2013 she had image guided biopsy of a small fibroadenoma in the upper outer quadrant of the left breast. Marker clip was placed. Recent screening mammogram shows that this has enlarged from 1.1-1.4 cm. Lobular mass. 1 o'clock position left breast. 6 cm from nipple. Excision was recommended.  She is not sure she can feel this but says it is a little bit tender there. Also noted were 3 stable benign right breast masses which are hypoechoic.  She is inclined to have the left breast mass excised.      Past history significant for right breast biopsy 1 and left breast biopsy 3 since she was a teenager. She says she thinks all these were fibroadenomas. Otherwise healthy other than BMI 36. Has no PCP Family history is negative for breast or ovarian cancer. Mother living has multiple sclerosis. Father living has diabetes and hypertension.      We discussed this for a long time. She is inclined to have this excised. I told her that the risk of this being cancer is pretty low and it is probably just a slowly enlarging fibroadenoma. She'll be scheduled for left breast lumpectomy with radioactive seed localization She agrees with this plan.   Operative Findings:       I could hear the normal  signal of the radioactive seed using the neoprobe both in the preop holding area and in the operating room.  The specimen contained a rubbery firm mass consistent with fibroadenoma.  The specimen mammogram showed the mass, original by a biopsy clip and radioactive seed.  Procedure in Detail:          Following the induction of general LMA anesthesia surgical timeout was performed.  The left breast was prepped and draped in a sterile fashion.  Intravenous antibiotics were given.  0.5% Marcaine was used as local infiltration anesthetic.    Using the neoprobe I found the seed.  It was high in the upper outer quadrant of the left breast.  A curvilinear scar was present from an all biopsy and this was just a little bit medial and superior.  I made a curvilinear incision which incorporated the all scar.  Lumpectomy was performed using electrocautery and the neoprobe.  The specimen mammogram looked good as described above.  The specimen was marked with silk sutures and a 6 color  Ink kit to  orient the pathologist.  The specimen was sent to pathology.  The wound was irrigated.  Hemostasis was excellent.  Lumpectomy cavity was marked with metal clips according to our protocol.  The deeper breast tissues were closed with interrupted 3-0 Vicryl sutures.  The skin was closed with a running subcuticular suture of 4-0 Monocryl and Dermabond.  Breast binder was placed and the patient taken to PACU in stable condition.  EBL 10 mL.  Counts correct.  Complications none.     Edsel Petrin. Dalbert Batman, M.D., FACS General and Minimally Invasive Surgery Breast and Colorectal Surgery  05/17/2016 12:21 PM

## 2016-05-17 NOTE — Anesthesia Postprocedure Evaluation (Signed)
Anesthesia Post Note  Patient: Bianca Bass  Procedure(s) Performed: Procedure(s) (LRB): LEFT BREAST LUMPECTOMY WITH RADIOACTIVE SEED LOCALIZATION (Left)  Patient location during evaluation: PACU Anesthesia Type: General Level of consciousness: awake and alert Pain management: pain level controlled Vital Signs Assessment: post-procedure vital signs reviewed and stable Respiratory status: spontaneous breathing, nonlabored ventilation, respiratory function stable and patient connected to nasal cannula oxygen Cardiovascular status: blood pressure returned to baseline and stable Postop Assessment: no signs of nausea or vomiting Anesthetic complications: no       Last Vitals:  Vitals:   05/17/16 1315 05/17/16 1321  BP: 134/78   Pulse: (!) 59 71  Resp: 12 (!) 22  Temp:      Last Pain:  Vitals:   05/17/16 1315  TempSrc:   PainSc: 3                  Catalina Gravel

## 2016-05-17 NOTE — Anesthesia Procedure Notes (Signed)
Procedure Name: LMA Insertion Date/Time: 05/17/2016 11:43 AM Performed by: Wanita Chamberlain Pre-anesthesia Checklist: Patient identified, Timeout performed, Emergency Drugs available, Suction available and Patient being monitored Patient Re-evaluated:Patient Re-evaluated prior to inductionOxygen Delivery Method: Circle system utilized Preoxygenation: Pre-oxygenation with 100% oxygen Intubation Type: IV induction Ventilation: Mask ventilation without difficulty LMA: LMA inserted LMA Size: 4.0 Number of attempts: 1 Placement Confirmation: breath sounds checked- equal and bilateral and positive ETCO2 Tube secured with: Tape Dental Injury: Teeth and Oropharynx as per pre-operative assessment

## 2016-05-17 NOTE — Discharge Instructions (Signed)
[facility] °Office Phone Number [phone] ° °BREAST BIOPSY/ PARTIAL MASTECTOMY: POST OP INSTRUCTIONS ° °Always review your discharge instruction sheet given to you by the facility where your surgery was performed. ° °IF YOU HAVE DISABILITY OR FAMILY LEAVE FORMS, YOU MUST BRING THEM TO THE OFFICE FOR PROCESSING.  DO NOT GIVE THEM TO YOUR DOCTOR. ° °1. A prescription for pain medication may be given to you upon discharge.  Take your pain medication as prescribed, if needed.  If narcotic pain medicine is not needed, then you may take acetaminophen (Tylenol) or ibuprofen (Advil) as needed. °2. Take your usually prescribed medications unless otherwise directed °3. If you need a refill on your pain medication, please contact your pharmacy.  They will contact our office to request authorization.  Prescriptions will not be filled after 5pm or on week-ends. °4. You should eat very light the first 24 hours after surgery, such as soup, crackers, pudding, etc.  Resume your normal diet the day after surgery. °5. Most patients will experience some swelling and bruising in the breast.  Ice packs and a good support bra will help.  Swelling and bruising can take several days to resolve.  °6. It is common to experience some constipation if taking pain medication after surgery.  Increasing fluid intake and taking a stool softener will usually help or prevent this problem from occurring.  A mild laxative (Milk of Magnesia or Miralax) should be taken according to package directions if there are no bowel movements after 48 hours. °7. Unless discharge instructions indicate otherwise, you may remove your bandages 24-48 hours after surgery, and you may shower at that time.  You may have steri-strips (small skin tapes) in place directly over the incision.  These strips should be left on the skin for 7-10 days.  If your surgeon used skin glue on the incision, you may shower in 24 hours.  The glue will flake off over the  next 2-3 weeks.  Any sutures or staples will be removed at the office during your follow-up visit. °8. ACTIVITIES:  You may resume regular daily activities (gradually increasing) beginning the next day.  Wearing a good support bra or sports bra minimizes pain and swelling.  You may have sexual intercourse when it is comfortable. °a. You may drive when you no longer are taking prescription pain medication, you can comfortably wear a seatbelt, and you can safely maneuver your car and apply brakes. °b. RETURN TO WORK:  ______________________________________________________________________________________ °9. You should see your doctor in the office for a follow-up appointment approximately two weeks after your surgery.  Your doctor’s nurse will typically make your follow-up appointment when she calls you with your pathology report.  Expect your pathology report 2-3 business days after your surgery.  You may call to check if you do not hear from us after three days. °10. OTHER INSTRUCTIONS: _______________________________________________________________________________________________ _____________________________________________________________________________________________________________________________________ °_____________________________________________________________________________________________________________________________________ °_____________________________________________________________________________________________________________________________________ ° °WHEN TO CALL YOUR DOCTOR: °1. Fever over 101.0 °2. Nausea and/or vomiting. °3. Extreme swelling or bruising. °4. Continued bleeding from incision. °5. Increased pain, redness, or drainage from the incision. ° °The clinic staff is available to answer your questions during regular business hours.  Please don’t hesitate to call and ask to speak to one of the nurses for clinical concerns.  If you have a medical emergency, go to the nearest  emergency room or call 911.  A surgeon from [facility] is always on call at the hospital. ° °For further questions, please visit [email or web address]  ° ° ° ° °  Post Anesthesia Home Care Instructions ° °Activity: °Get plenty of rest for the remainder of the day. A responsible individual must stay with you for 24 hours following the procedure.  °For the next 24 hours, DO NOT: °-Drive a car °-Operate machinery °-Drink alcoholic beverages °-Take any medication unless instructed by your physician °-Make any legal decisions or sign important papers. ° °Meals: °Start with liquid foods such as gelatin or soup. Progress to regular foods as tolerated. Avoid greasy, spicy, heavy foods. If nausea and/or vomiting occur, drink only clear liquids until the nausea and/or vomiting subsides. Call your physician if vomiting continues. ° °Special Instructions/Symptoms: °Your throat may feel dry or sore from the anesthesia or the breathing tube placed in your throat during surgery. If this causes discomfort, gargle with warm salt water. The discomfort should disappear within 24 hours. ° °If you had a scopolamine patch placed behind your ear for the management of post- operative nausea and/or vomiting: ° °1. The medication in the patch is effective for 72 hours, after which it should be removed.  Wrap patch in a tissue and discard in the trash. Wash hands thoroughly with soap and water. °2. You may remove the patch earlier than 72 hours if you experience unpleasant side effects which may include dry mouth, dizziness or visual disturbances. °3. Avoid touching the patch. Wash your hands with soap and water after contact with the patch. °  ° °

## 2016-05-17 NOTE — Transfer of Care (Signed)
Immediate Anesthesia Transfer of Care Note  Patient: Bianca Bass  Procedure(s) Performed: Procedure(s): LEFT BREAST LUMPECTOMY WITH RADIOACTIVE SEED LOCALIZATION (Left)  Patient Location: PACU  Anesthesia Type:General  Level of Consciousness: awake, alert , oriented and patient cooperative  Airway & Oxygen Therapy: Patient Spontanous Breathing and Patient connected to face mask oxygen  Post-op Assessment: Report given to RN and Post -op Vital signs reviewed and stable  Post vital signs: Reviewed and stable  Last Vitals:  Vitals:   05/17/16 1003  BP: 122/72  Pulse: 70  Resp: 16  Temp: 36.7 C    Last Pain:  Vitals:   05/17/16 1003  TempSrc: Oral         Complications: No apparent anesthesia complications

## 2016-05-17 NOTE — Interval H&P Note (Signed)
History and Physical Interval Note:  05/17/2016 10:03 AM  Bianca Bass  has presented today for surgery, with the diagnosis of LEFT BREAST FIBROADENOMA  The various methods of treatment have been discussed with the patient and family. After consideration of risks, benefits and other options for treatment, the patient has consented to  Procedure(s): LEFT BREAST LUMPECTOMY WITH RADIOACTIVE SEED LOCALIZATION (Left) as a surgical intervention .  The patient's history has been reviewed, patient examined, no change in status, stable for surgery.  I have reviewed the patient's chart and labs.  Questions were answered to the patient's satisfaction.     Adin Hector

## 2016-05-20 ENCOUNTER — Encounter (HOSPITAL_BASED_OUTPATIENT_CLINIC_OR_DEPARTMENT_OTHER): Payer: Self-pay | Admitting: General Surgery

## 2016-05-20 NOTE — Progress Notes (Signed)
Inform patient of Pathology report,. Final pathology on her breast lumpectomy shows benign fibroadenoma No cancer I will discuss in detail with her at her next office visit  hmi

## 2016-09-23 ENCOUNTER — Ambulatory Visit: Payer: Managed Care, Other (non HMO) | Admitting: Family Medicine

## 2017-03-07 ENCOUNTER — Other Ambulatory Visit: Payer: Self-pay | Admitting: Obstetrics and Gynecology

## 2017-03-07 DIAGNOSIS — Z1231 Encounter for screening mammogram for malignant neoplasm of breast: Secondary | ICD-10-CM

## 2017-03-31 ENCOUNTER — Ambulatory Visit
Admission: RE | Admit: 2017-03-31 | Discharge: 2017-03-31 | Disposition: A | Discharge status: Home / Self Care | Payer: Managed Care, Other (non HMO) | Source: Ambulatory Visit | Attending: Obstetrics and Gynecology | Admitting: Obstetrics and Gynecology

## 2017-03-31 ENCOUNTER — Other Ambulatory Visit: Payer: Self-pay | Admitting: Obstetrics and Gynecology

## 2017-03-31 DIAGNOSIS — Z1231 Encounter for screening mammogram for malignant neoplasm of breast: Secondary | ICD-10-CM

## 2017-03-31 DIAGNOSIS — N6310 Unspecified lump in the right breast, unspecified quadrant: Secondary | ICD-10-CM

## 2017-05-16 ENCOUNTER — Ambulatory Visit (HOSPITAL_COMMUNITY)
Admission: EM | Admit: 2017-05-16 | Discharge: 2017-05-16 | Disposition: A | Discharge status: Home / Self Care | Payer: Managed Care, Other (non HMO) | Attending: Family Medicine | Admitting: Family Medicine

## 2017-05-16 ENCOUNTER — Encounter (HOSPITAL_COMMUNITY): Payer: Self-pay

## 2017-05-16 ENCOUNTER — Other Ambulatory Visit: Payer: Self-pay

## 2017-05-16 DIAGNOSIS — R0789 Other chest pain: Secondary | ICD-10-CM | POA: Diagnosis not present

## 2017-05-16 MED ORDER — MELOXICAM 15 MG PO TABS: 15.0000 mg | ORAL_TABLET | Freq: Every day | ORAL | 0 refills | Status: DC

## 2017-05-16 NOTE — ED Triage Notes (Signed)
Pt presents with chest pain that is centralized, pressure in nature that radiates to her left arm. History of inflammation of chest wall, states this feels the same other than the radiation to her arm.

## 2017-05-16 NOTE — ED Provider Notes (Signed)
[facility]    CSN: [id number] Arrival date & time: 05/16/17  1001     History   Chief Complaint Chief Complaint  Patient presents with  . Chest Pain    HPI Bianca Bass is a 47 y.o. female.   Chest pain times 3 days.  She has had similar chest pain and told she had inflammation in her chest wall. Regarding risk factors she has none and she is early menopausal with some early hot flashes.  HPI  Past Medical History:  Diagnosis Date  . Complication of anesthesia   . Fibroadenoma of breast, left   . 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 PONV (postoperative nausea and vomiting)     Patient Active Problem List   Diagnosis Date Noted  . 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 MASTODYNIA 06/01/2008  . CHEST PAIN, ATYPICAL 06/01/2008    Past Surgical History:  Procedure Laterality Date  . BREAST BIOPSY Left    3  . BREAST BIOPSY Right    1  . BREAST EXCISIONAL BIOPSY Left 05/17/2016  . BREAST LUMPECTOMY WITH RADIOACTIVE SEED LOCALIZATION Left 05/17/2016   Procedure: LEFT BREAST LUMPECTOMY WITH RADIOACTIVE SEED LOCALIZATION;  Surgeon: Fanny Skates, MD;  Location: [facility];  Service: General;  Laterality: Left;  . DILATION AND CURETTAGE OF UTERUS    . ENDOMETRIAL ABLATION  2016  . KNEE ARTHROSCOPY Bilateral   . TUBAL LIGATION      OB History   None      Home Medications    Prior to Admission medications   Medication Sig Start Date End Date Taking? Authorizing Provider  norethindrone (AYGESTIN) 5 MG tablet Take by mouth daily.   Yes [provider]    Family History Family History  Problem Relation Age of Onset  . Breast cancer Neg Hx     Social History Social History   Tobacco Use  . Smoking status: Never Smoker  . Smokeless tobacco: Never Used  Substance Use Topics  . Alcohol use: No  . Drug use: No     Allergies   Patient has no known allergies.   Review of Systems Review of Systems    Constitutional: Negative.   HENT: Negative.   Respiratory: Negative.   Cardiovascular: Positive for chest pain.  Gastrointestinal: Negative.   Genitourinary: Negative.   Psychiatric/Behavioral: Negative.      Physical Exam Triage Vital Signs ED Triage Vitals [05/16/17 1029]  Enc Vitals Group     BP (!) 152/80     Pulse Rate 75     Resp 19     Temp 97.7 F (36.5 C)     Temp src      SpO2 97 %     Weight 235 lb (106.6 kg)     Height      Head Circumference      Peak Flow      Pain Score 5     Pain Loc      Pain Edu?      Excl. in [place]?    No data found.  Updated Vital Signs BP (!) 152/80   Pulse 75   Temp 97.7 F (36.5 C)   Resp 19   Wt 235 lb (106.6 kg)   SpO2 97%   BMI 36.53 kg/m   Visual Acuity Right Eye Distance:   Left Eye Distance:   Bilateral Distance:    Right Eye Near:   Left Eye Near:  Bilateral Near:     Physical Exam  Constitutional: She appears well-developed and well-nourished.  Cardiovascular: Normal rate, regular rhythm and normal pulses.  Pulmonary/Chest: Effort normal and breath sounds normal.     UC Treatments / Results  Labs (all labs ordered are listed, but only abnormal results are displayed) Labs Reviewed - No data to display  EKG None Radiology No results found.  Procedures Procedures (including critical care time)  Medications Ordered in UC Medications - No data to display   Initial Impression / Assessment and Plan / UC Course  I have reviewed the triage vital signs and the nursing notes.  Pertinent labs & imaging results that were available during my care of the patient were reviewed by me and considered in my medical decision making (see chart for details).     Chest wall pain.  I have reviewed cardiogram and it is totally normal.  There is tenderness on palpation of the chest wall.  I will provide meloxicam 15 mg as anti-inflammatory  Final Clinical Impressions(s) / UC Diagnoses   Final diagnoses:   None    ED Discharge Orders    None       Controlled Substance Prescriptions [place] Controlled Substance Registry consulted? No   Wardell Honour, MD 05/16/17 1049

## 2017-05-19 ENCOUNTER — Telehealth (HOSPITAL_COMMUNITY): Payer: Self-pay | Admitting: Emergency Medicine

## 2017-05-19 NOTE — Telephone Encounter (Signed)
Called [facility] [place], [place]:  Prednisone 40 mg q day x 5 days, no refills.  meds ordered by amy yu, [place].  Patient initially called about meloxicam not being effective.    Patient is aware of new med being called in to [facility]/[place]

## 2018-04-08 ENCOUNTER — Other Ambulatory Visit: Payer: Self-pay | Admitting: Obstetrics and Gynecology

## 2018-04-08 DIAGNOSIS — Z1231 Encounter for screening mammogram for malignant neoplasm of breast: Secondary | ICD-10-CM

## 2018-04-09 ENCOUNTER — Encounter (HOSPITAL_COMMUNITY): Payer: Self-pay

## 2018-04-09 ENCOUNTER — Other Ambulatory Visit: Payer: Self-pay

## 2018-04-09 ENCOUNTER — Ambulatory Visit (HOSPITAL_COMMUNITY)
Admission: EM | Admit: 2018-04-09 | Discharge: 2018-04-09 | Disposition: A | Discharge status: Home / Self Care | Payer: Managed Care, Other (non HMO) | Attending: Family Medicine | Admitting: Family Medicine

## 2018-04-09 ENCOUNTER — Ambulatory Visit (INDEPENDENT_AMBULATORY_CARE_PROVIDER_SITE_OTHER): Payer: Managed Care, Other (non HMO)

## 2018-04-09 DIAGNOSIS — R0789 Other chest pain: Secondary | ICD-10-CM | POA: Diagnosis not present

## 2018-04-09 DIAGNOSIS — M940 Chondrocostal junction syndrome [Tietze]: Secondary | ICD-10-CM

## 2018-04-09 MED ORDER — PREDNISONE 20 MG PO TABS: 40.0000 mg | ORAL_TABLET | Freq: Every day | ORAL | 0 refills | Status: DC

## 2018-04-09 NOTE — ED Triage Notes (Signed)
Pt presents with ongoing chest and breast pain from unknown source.

## 2018-04-09 NOTE — Discharge Instructions (Signed)
Please try the medicine  Please follow up or seek immediate help if you feel your symptoms worse.  Please follow up with [place] if your symptoms fail to improve.

## 2018-04-09 NOTE — ED Provider Notes (Signed)
[facility]    CSN: [id number] Arrival date & time: 04/09/18  1735     History   Chief Complaint Chief Complaint  Patient presents with  . Chest & Breast Pain    HPI ISATOU AGREDANO Bianca a 48 y.o. Bass.   She Bianca presenting with a several day history of centralized chest pain with loss of breast soreness.  She has a history of the same kind of pain.  She reports that she has been told that she has inflammation of the chest.  Denies any new or different medications.  Has no travel recently.  Denies any shortness of breath.  No diaphoresis.  No radiation to the arm.  No cardiac history.  No family history of any cardiac history.  Does not seem to be associated with exercise.  Has reproduction of pain with touching.  HPI  Past Medical History:  Diagnosis Date  . Complication of anesthesia   . Fibroadenoma of breast, left   . 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 PONV (postoperative nausea and vomiting)     Patient Active Problem List   Diagnosis Date Noted  . 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 MASTODYNIA 06/01/2008  . Chest wall pain 06/01/2008    Past Surgical History:  Procedure Laterality Date  . BREAST BIOPSY Left    3  . BREAST BIOPSY Right    1  . BREAST EXCISIONAL BIOPSY Left 05/17/2016  . BREAST LUMPECTOMY WITH RADIOACTIVE SEED LOCALIZATION Left 05/17/2016   Procedure: LEFT BREAST LUMPECTOMY WITH RADIOACTIVE SEED LOCALIZATION;  Surgeon: Fanny Skates, MD;  Location: [facility];  Service: General;  Laterality: Left;  . DILATION AND CURETTAGE OF UTERUS    . ENDOMETRIAL ABLATION  2016  . KNEE ARTHROSCOPY Bilateral   . TUBAL LIGATION      OB History   No obstetric history on file.      Home Medications    Prior to Admission medications   Medication Sig Start Date End Date Taking? Authorizing Provider  meloxicam (MOBIC) 15 MG tablet Take 1 tablet (15 mg total) by mouth daily. 05/16/17   Wardell Honour, MD   norethindrone (AYGESTIN) 5 MG tablet Take by mouth daily.    [provider]  predniSONE (DELTASONE) 20 MG tablet Take 2 tablets (40 mg total) by mouth daily with breakfast. 04/09/18   Rosemarie Ax, MD    Family History Family History  Problem Relation Age of Onset  . Breast cancer Neg Hx     Social History Social History   Tobacco Use  . Smoking status: Never Smoker  . Smokeless tobacco: Never Used  Substance Use Topics  . Alcohol use: No  . Drug use: No     Allergies   Patient has no known allergies.   Review of Systems Review of Systems  Constitutional: Negative for fever.  HENT: Negative for congestion.   Respiratory: Negative for cough.   Cardiovascular: Positive for chest pain.  Gastrointestinal: Negative for abdominal pain.  Musculoskeletal: Negative for back pain.  Skin: Negative for color change.  Neurological: Negative for weakness.  Hematological: Negative for adenopathy.  Psychiatric/Behavioral: Negative for agitation.     Physical Exam Triage Vital Signs ED Triage Vitals  Enc Vitals Group     BP 04/09/18 1823 (!) 159/99     Pulse Rate 04/09/18 1823 76     Resp 04/09/18 1823 18     Temp 04/09/18 1823 97.7 F (36.5 C)  Temp Source 04/09/18 1823 Oral     SpO2 04/09/18 1823 100 %     Weight --      Height --      Head Circumference --      Peak Flow --      Pain Score 04/09/18 1828 7     Pain Loc --      Pain Edu? --      Excl. in [place]? --    No data found.  Updated Vital Signs BP (!) 159/99 (BP Location: Right Arm)   Pulse 76   Temp 97.7 F (36.5 C) (Oral)   Resp 18   SpO2 100%   Visual Acuity Right Eye Distance:   Left Eye Distance:   Bilateral Distance:    Right Eye Near:   Left Eye Near:    Bilateral Near:     Physical Exam Gen: NAD, alert, cooperative with exam, well-appearing ENT: normal lips, normal nasal mucosa,  Eye: normal EOM, normal conjunctiva and lids CV:  no edema, +2 pedal pulses, regular rate  and rhythm, Chest: Reproduction of symptoms with palpation of the sternum Resp: no accessory muscle use, non-labored, clear to auscultation bilaterally GI: no masses or tenderness, no hernia  Skin: no rashes, no areas of induration  Neuro: normal tone, normal sensation to touch Psych:  normal insight, alert and oriented MSK: normal gait, normal strength   UC Treatments / Results  Labs (all labs ordered are listed, but only abnormal results are displayed) Labs Reviewed - No data to display  EKG None  Radiology Dg Chest 2 View  Result Date: 04/09/2018 CLINICAL DATA:  Chest pressure with some URI symptoms for 3 days. EXAM: CHEST - 2 VIEW COMPARISON:  Chest CTA, 06/01/2008 FINDINGS: The heart size and mediastinal contours are within normal limits. Both lungs are clear. No pleural effusion or pneumothorax. The visualized skeletal structures are unremarkable. IMPRESSION: No active cardiopulmonary disease. Electronically Signed   By: Lajean Manes M.D.   On: 04/09/2018 19:01    Procedures Procedures (including critical care time)  Medications Ordered in UC Medications - No data to display  Initial Impression / Assessment and Plan / UC Course  I have reviewed the triage vital signs and the nursing notes.  Pertinent labs & imaging results that were available during my care of the patient were reviewed by me and considered in my medical decision making (see chart for details).     Raihana Bianca a 48 year old Bass Bianca presenting with chest wall pain not suggestive costochondritis.  EKG shows normal sinus rhythm.  Chest x-ray was reassuring.  Pain Bianca reproducible on exam.  Provided with prednisone.  Given indications to follow-up or seek immediate care for symptoms seem to worsen.  May need to have lab work to evaluate an autoimmune problem.  Final Clinical Impressions(s) / UC Diagnoses   Final diagnoses:  Costochondritis     Discharge Instructions     Please try the medicine   Please follow up or seek immediate help if you feel your symptoms worse.  Please follow up with [place] if your symptoms fail to improve.     ED Prescriptions    Medication Sig Dispense Auth. Provider   predniSONE (DELTASONE) 20 MG tablet Take 2 tablets (40 mg total) by mouth daily with breakfast. 10 tablet Rosemarie Ax, MD     Controlled Substance Prescriptions [place] Controlled Substance Registry consulted? Not Applicable   Rosemarie Ax, MD 04/09/18 [phone]

## 2018-04-17 ENCOUNTER — Telehealth: Payer: Self-pay

## 2018-04-17 NOTE — Telephone Encounter (Signed)
Author phoned pt. To reschedule new pt. appointment per provider's request. Appointment rescheduled for 3/30 at 1030AM.

## 2018-04-20 ENCOUNTER — Ambulatory Visit: Payer: Self-pay | Admitting: Family Medicine

## 2018-04-27 ENCOUNTER — Ambulatory Visit: Payer: Self-pay | Admitting: Family Medicine

## 2018-05-04 ENCOUNTER — Ambulatory Visit: Payer: Managed Care, Other (non HMO)

## 2018-09-28 ENCOUNTER — Ambulatory Visit
Admission: RE | Admit: 2018-09-28 | Discharge: 2018-09-28 | Disposition: A | Discharge status: Home / Self Care | Payer: Managed Care, Other (non HMO) | Source: Ambulatory Visit | Attending: Obstetrics and Gynecology | Admitting: Obstetrics and Gynecology

## 2018-09-28 ENCOUNTER — Other Ambulatory Visit: Payer: Self-pay

## 2018-09-28 DIAGNOSIS — Z1231 Encounter for screening mammogram for malignant neoplasm of breast: Secondary | ICD-10-CM

## 2018-09-30 ENCOUNTER — Other Ambulatory Visit: Payer: Self-pay | Admitting: Obstetrics and Gynecology

## 2018-09-30 DIAGNOSIS — R928 Other abnormal and inconclusive findings on diagnostic imaging of breast: Secondary | ICD-10-CM

## 2018-10-07 ENCOUNTER — Other Ambulatory Visit: Payer: Self-pay

## 2018-10-07 ENCOUNTER — Ambulatory Visit: Payer: Managed Care, Other (non HMO)

## 2018-10-07 ENCOUNTER — Ambulatory Visit
Admission: RE | Admit: 2018-10-07 | Discharge: 2018-10-07 | Disposition: A | Discharge status: Home / Self Care | Payer: Managed Care, Other (non HMO) | Source: Ambulatory Visit | Attending: Obstetrics and Gynecology | Admitting: Obstetrics and Gynecology

## 2018-10-07 DIAGNOSIS — R928 Other abnormal and inconclusive findings on diagnostic imaging of breast: Secondary | ICD-10-CM

## 2019-05-24 ENCOUNTER — Ambulatory Visit: Payer: Managed Care, Other (non HMO)

## 2019-05-24 ENCOUNTER — Other Ambulatory Visit: Payer: Self-pay

## 2019-05-24 ENCOUNTER — Ambulatory Visit (HOSPITAL_COMMUNITY)
Admission: EM | Admit: 2019-05-24 | Discharge: 2019-05-24 | Disposition: A | Discharge status: Home / Self Care | Payer: Managed Care, Other (non HMO) | Attending: Family Medicine | Admitting: Family Medicine

## 2019-05-24 ENCOUNTER — Ambulatory Visit
Admit: 2019-05-24 | Discharge: 2019-05-24 | Disposition: A | Discharge status: Home / Self Care | Payer: Managed Care, Other (non HMO) | Attending: Family Medicine | Admitting: Family Medicine

## 2019-05-24 DIAGNOSIS — R52 Pain, unspecified: Secondary | ICD-10-CM | POA: Diagnosis not present

## 2019-05-24 DIAGNOSIS — N63 Unspecified lump in unspecified breast: Secondary | ICD-10-CM | POA: Diagnosis not present

## 2019-05-24 DIAGNOSIS — N6311 Unspecified lump in the right breast, upper outer quadrant: Secondary | ICD-10-CM | POA: Diagnosis not present

## 2019-05-24 DIAGNOSIS — N644 Mastodynia: Secondary | ICD-10-CM | POA: Diagnosis not present

## 2019-05-24 DIAGNOSIS — N6312 Unspecified lump in the right breast, upper inner quadrant: Secondary | ICD-10-CM

## 2019-05-24 NOTE — ED Provider Notes (Signed)
[name]    CSN: [id number] Arrival date & time: 05/24/19  1120      History   Chief Complaint Chief Complaint  Patient presents with  . Breast Pain    HPI Bianca Bass is a 49 y.o. female.   Patient reports with right-sided breast pain that include the upper left and right outer quadrant.  Reports that pain extends to the upper chest and under her right arm.  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  Denies ever having pain with any of these before.  Reports that she also has hardened area across the top of her entire right breast that also extends into her right underarm.  Has tried Tylenol and ibuprofen with no relief.  Reports that the pain gets worse with deep breathing, movement.  Also reports dull aching while at rest.  ROS per HPI  The history is provided by the patient.    Past Medical History:  Diagnosis Date  . Complication of anesthesia   . Fibroadenoma of breast, left   . 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 PONV (postoperative nausea and vomiting)     Patient Active Problem List   Diagnosis Date Noted  . 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 MASTODYNIA 06/01/2008  . Chest wall pain 06/01/2008    Past Surgical History:  Procedure Laterality Date  . BREAST BIOPSY Left    3  . BREAST BIOPSY Right    1  . BREAST EXCISIONAL BIOPSY Left 05/17/2016  . BREAST LUMPECTOMY WITH RADIOACTIVE SEED LOCALIZATION Left 05/17/2016   Procedure: LEFT BREAST LUMPECTOMY WITH RADIOACTIVE SEED LOCALIZATION;  Surgeon: Fanny Skates, MD;  Location: [facility];  Service: General;  Laterality: Left;  . DILATION AND CURETTAGE OF UTERUS    . ENDOMETRIAL ABLATION  2016  . KNEE ARTHROSCOPY Bilateral   . TUBAL LIGATION      OB History   No obstetric history on file.      Home Medications    Prior to Admission medications   Medication Sig Start Date End Date Taking?  Authorizing Provider  meloxicam (MOBIC) 15 MG tablet Take 1 tablet (15 mg total) by mouth daily. 05/16/17   Wardell Honour, MD  norethindrone (AYGESTIN) 5 MG tablet Take by mouth daily.    [provider]  predniSONE (DELTASONE) 20 MG tablet Take 2 tablets (40 mg total) by mouth daily with breakfast. 04/09/18   Rosemarie Ax, MD    Family History Family History  Problem Relation Age of Onset  . Breast cancer Neg Hx     Social History Social History   Tobacco Use  . Smoking status: Never Smoker  . Smokeless tobacco: Never Used  Substance Use Topics  . Alcohol use: No  . Drug use: No     Allergies   Patient has no known allergies.   Review of Systems Review of Systems   Physical Exam Triage Vital Signs ED Triage Vitals [05/24/19 1147]  Enc Vitals Group     BP (!) 141/85     Pulse Rate 61     Resp 16     Temp 98.1 F (36.7 C)     Temp src      SpO2 98 %     Weight      Height      Head Circumference      Peak Flow      Pain  Score 7     Pain Loc      Pain Edu?      Excl. in [place]?    No data found.  Updated Vital Signs BP (!) 141/85   Pulse 61   Temp 98.1 F (36.7 C)   Resp 16   SpO2 98%   Visual Acuity Right Eye Distance:   Left Eye Distance:   Bilateral Distance:    Right Eye Near:   Left Eye Near:    Bilateral Near:     Physical Exam Vitals and nursing note reviewed.  Constitutional:      General: She is not in acute distress.    Appearance: Normal appearance. She is well-developed and normal weight. She is not ill-appearing.  HENT:     Head: Normocephalic and atraumatic.     Nose: Nose normal.  Eyes:     Extraocular Movements: Extraocular movements intact.     Conjunctiva/sclera: Conjunctivae normal.     Pupils: Pupils are equal, round, and reactive to light.  Cardiovascular:     Rate and Rhythm: Normal rate and regular rhythm.     Heart sounds: Normal heart sounds. No murmur.  Pulmonary:     Effort: Pulmonary effort is  normal. No respiratory distress.     Breath sounds: Normal breath sounds. No stridor. No wheezing, rhonchi or rales.  Chest:     Chest wall: Tenderness present.     Breasts: Breasts are asymmetrical.        Right: Tenderness present.        Comments: Area of induration, area of tenderness with movement and palpation. Abdominal:     General: Bowel sounds are normal.     Palpations: Abdomen is soft.     Tenderness: There is no abdominal tenderness.  Musculoskeletal:     Cervical back: Normal range of motion and neck supple.  Lymphadenopathy:     Cervical: No cervical adenopathy.     Upper Body:     Right upper body: Axillary adenopathy present.  Skin:    General: Skin is warm and dry.     Capillary Refill: Capillary refill takes less than 2 seconds.  Neurological:     General: No focal deficit present.     Mental Status: She is alert and oriented to person, place, and time.  Psychiatric:        Mood and Affect: Mood normal.        Behavior: Behavior normal.        Thought Content: Thought content normal.      UC Treatments / Results  Labs (all labs ordered are listed, but only abnormal results are displayed) Labs Reviewed - No data to display  EKG   Radiology MM DIAG BREAST TOMO UNI RIGHT  Result Date: 05/24/2019 CLINICAL DATA:  49 year old presenting with diffuse RIGHT breast pain which she initially noted approximately 1 week ago.Patient denies palpable lumps. EXAM: DIGITAL DIAGNOSTIC UNILATERAL RIGHT MAMMOGRAM WITH CAD AND TOMO COMPARISON:  Previous exam(s). ACR Breast Density Category b: There are scattered areas of fibroglandular density. FINDINGS: Tomosynthesis and synthesized full field CC and MLO views of the RIGHT breast were obtained. No findings suspicious for malignancy in the RIGHT breast. Mammographic images were processed with CAD. IMPRESSION: No mammographic evidence of malignancy involving the RIGHT breast. RECOMMENDATION: Annual BILATERAL screening  mammography which is due in September, 2021. Strategies for alleviating breast pain including decreasing caffeine intake, vitamin-E supplementation, and avoidance of tight compression brassieres, including underwire brassieres, were discussed  with the patient. I have discussed the findings and recommendations with the patient. If applicable, a reminder letter will be sent to the patient regarding the next appointment. BI-RADS CATEGORY  1: Negative. Electronically Signed   By: Evangeline Dakin M.D.   On: 05/24/2019 14:39    Procedures Procedures (including critical care time)  Medications Ordered in UC Medications - No data to display  Initial Impression / Assessment and Plan / UC Course  I have reviewed the triage vital signs and the nursing notes.  Pertinent labs & imaging results that were available during my care of the patient were reviewed by me and considered in my medical decision making (see chart for details).     Breast pain, right: Presents with right breast pain for the last week.  Not relieved with Tylenol or ibuprofen.  Also presents with a large fibrous tissue on chest wall on the right inner quadrant, right upper quadrant and axillary tail of the right breast, and axillary adenopathy to the right side.  Patient has history of fibroadenoma of the breast and also has history of cyst removal of the breast.  Spoke with the breast center at [place], orders were put in for diagnostic ultrasound as well as diagnostic mammogram with Tomo.  Patient instructed to call as soon as she leaves our office to make an appointment that works for her schedule.  She will follow-up with them and with her gynecologist as well.  She reports that she tried to do this earlier today, but was told that she needed a referral from primary care, gynecology for from urgent care.  Patient verbalized understanding is in agreement with treatment plan. Final Clinical Impressions(s) / UC Diagnoses   Final  diagnoses:  Breast lump in female  Breast pain in female  Pain  Lump in upper outer quadrant of right breast  Lump in upper inner quadrant of right breast  Lump in axillary tail of breast     Discharge Instructions     I have spoken with the breast center and have ordered an ultrasound of the right breast as well as a diagnostic mammogram with tomography for your right breast.  Once you leave our office, you may call them at [phone] to schedule a time that works for you.  Follow-up with the breast center.    ED Prescriptions    None     PDMP not reviewed this encounter.   Faustino Congress, NP 05/25/19 1014

## 2019-05-24 NOTE — Discharge Instructions (Addendum)
I have spoken with the breast center and have ordered an ultrasound of the right breast as well as a diagnostic mammogram with tomography for your right breast.  Once you leave our office, you may call them at [phone] to schedule a time that works for you.  Follow-up with the breast center.

## 2019-05-24 NOTE — ED Triage Notes (Signed)
R breast pain x 1 week, hx of surgery to that breast to remove cysts

## 2020-03-16 ENCOUNTER — Encounter (HOSPITAL_COMMUNITY): Payer: Self-pay

## 2020-03-16 ENCOUNTER — Ambulatory Visit (HOSPITAL_COMMUNITY)
Admission: EM | Admit: 2020-03-16 | Discharge: 2020-03-16 | Disposition: A | Discharge status: Home / Self Care | Payer: Managed Care, Other (non HMO) | Attending: Family Medicine | Admitting: Family Medicine

## 2020-03-16 ENCOUNTER — Ambulatory Visit (INDEPENDENT_AMBULATORY_CARE_PROVIDER_SITE_OTHER): Payer: Managed Care, Other (non HMO)

## 2020-03-16 DIAGNOSIS — Y9354 Activity, bowling: Secondary | ICD-10-CM | POA: Diagnosis not present

## 2020-03-16 DIAGNOSIS — M79641 Pain in right hand: Secondary | ICD-10-CM

## 2020-03-16 MED ORDER — PREDNISONE 20 MG PO TABS: 40.0000 mg | ORAL_TABLET | Freq: Every day | ORAL | 0 refills | Status: DC

## 2020-03-16 MED ORDER — CYCLOBENZAPRINE HCL 5 MG PO TABS: 5.0000 mg | ORAL_TABLET | Freq: Three times a day (TID) | ORAL | 0 refills | Status: DC | PRN

## 2020-03-16 NOTE — ED Triage Notes (Signed)
Pt injured right hand a month ago while bowling , pain and swelling persist

## 2020-03-20 NOTE — ED Provider Notes (Signed)
[facility]    CSN: [id number] Arrival date & time: 03/16/20  3614      History   Chief Complaint Chief Complaint  Patient presents with  . Hand Pain    HPI Bianca Bass is a 50 y.o. female.   Here today with right hand pain and swelling at base of middle finger since going bowling about 1 month ago. Thinks she may have jammed it picking up a ball at one point but unsure of what exactly could have been the cause. Has been using OTC pain relievers without benefit so far. Able to move hand, no discoloration, numbness, tingling. No past hx of trauma to the hand.      Past Medical History:  Diagnosis Date  . Complication of anesthesia   . Fibroadenoma of breast, left   . 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 PONV (postoperative nausea and vomiting)     Patient Active Problem List   Diagnosis Date Noted  . 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 MASTODYNIA 06/01/2008  . Chest wall pain 06/01/2008    Past Surgical History:  Procedure Laterality Date  . BREAST BIOPSY Left    3  . BREAST BIOPSY Right    1  . BREAST EXCISIONAL BIOPSY Left 05/17/2016  . BREAST LUMPECTOMY WITH RADIOACTIVE SEED LOCALIZATION Left 05/17/2016   Procedure: LEFT BREAST LUMPECTOMY WITH RADIOACTIVE SEED LOCALIZATION;  Surgeon: Fanny Skates, MD;  Location: [facility];  Service: General;  Laterality: Left;  . DILATION AND CURETTAGE OF UTERUS    . ENDOMETRIAL ABLATION  2016  . KNEE ARTHROSCOPY Bilateral   . TUBAL LIGATION      OB History   No obstetric history on file.      Home Medications    Prior to Admission medications   Medication Sig Start Date End Date Taking? Authorizing Provider  cyclobenzaprine (FLEXERIL) 5 MG tablet Take 1 tablet (5 mg total) by mouth 3 (three) times daily as needed for muscle spasms. DO NOT DRINK ALCOHOL OR DRIVE WHILE TAKING THIS MEDICATION - MAY CAUSE DROWSINESS 03/16/20  Yes Volney American, PA-C  meloxicam  (MOBIC) 15 MG tablet Take 1 tablet (15 mg total) by mouth daily. 05/16/17   Wardell Honour, MD  norethindrone (AYGESTIN) 5 MG tablet Take by mouth daily.    [provider]  predniSONE (DELTASONE) 20 MG tablet Take 2 tablets (40 mg total) by mouth daily with breakfast. 03/16/20   Volney American, PA-C    Family History Family History  Problem Relation Age of Onset  . Breast cancer Neg Hx     Social History Social History   Tobacco Use  . Smoking status: Never Smoker  . Smokeless tobacco: Never Used  Substance Use Topics  . Alcohol use: No  . Drug use: No     Allergies   Patient has no known allergies.   Review of Systems Review of Systems PER HPI   Physical Exam Triage Vital Signs ED Triage Vitals  Enc Vitals Group     BP 03/16/20 0822 136/84     Pulse Rate 03/16/20 0822 72     Resp 03/16/20 0822 18     Temp 03/16/20 0822 97.9 F (36.6 C)     Temp src --      SpO2 03/16/20 0822 97 %     Weight --      Height --      Head Circumference --  Peak Flow --      Pain Score 03/16/20 0819 5     Pain Loc --      Pain Edu? --      Excl. in [place]? --    No data found.  Updated Vital Signs BP 136/84   Pulse 72   Temp 97.9 F (36.6 C)   Resp 18   SpO2 97%   Visual Acuity Right Eye Distance:   Left Eye Distance:   Bilateral Distance:    Right Eye Near:   Left Eye Near:    Bilateral Near:     Physical Exam Vitals and nursing note reviewed.  Constitutional:      Appearance: Normal appearance. She is not ill-appearing.  HENT:     Head: Atraumatic.  Eyes:     Extraocular Movements: Extraocular movements intact.     Conjunctiva/sclera: Conjunctivae normal.  Cardiovascular:     Rate and Rhythm: Normal rate and regular rhythm.     Pulses: Normal pulses.     Heart sounds: Normal heart sounds.  Pulmonary:     Effort: Pulmonary effort is normal. No respiratory distress.     Breath sounds: Normal breath sounds. No wheezing.   Musculoskeletal:        General: Tenderness (mild ttp at base of right middle finger) present. Normal range of motion.     Cervical back: Normal range of motion and neck supple.  Skin:    General: Skin is warm and dry.     Findings: No bruising, erythema or lesion.  Neurological:     Mental Status: She is alert and oriented to person, place, and time.     Sensory: No sensory deficit.     Motor: No weakness.  Psychiatric:        Mood and Affect: Mood normal.        Thought Content: Thought content normal.        Judgment: Judgment normal.     UC Treatments / Results  Labs (all labs ordered are listed, but only abnormal results are displayed) Labs Reviewed - No data to display  EKG   Radiology No results found.  Procedures Procedures (including critical care time)  Medications Ordered in UC Medications - No data to display  Initial Impression / Assessment and Plan / UC Course  I have reviewed the triage vital signs and the nursing notes.  Pertinent labs & imaging results that were available during my care of the patient were reviewed by me and considered in my medical decision making (see chart for details).     Right hand x-ray negative for bony injury. Discussed given duration of pain sxs will treat with prednisone burst, flexeril, epsom salt soaks, stretches. Return for unresolving sxs.   Final Clinical Impressions(s) / UC Diagnoses   Final diagnoses:  Right hand pain   Discharge Instructions   None    ED Prescriptions    Medication Sig Dispense Auth. Provider   predniSONE (DELTASONE) 20 MG tablet Take 2 tablets (40 mg total) by mouth daily with breakfast. 10 tablet Volney American, PA-C   cyclobenzaprine (FLEXERIL) 5 MG tablet Take 1 tablet (5 mg total) by mouth 3 (three) times daily as needed for muscle spasms. DO NOT DRINK ALCOHOL OR DRIVE WHILE TAKING THIS MEDICATION - MAY CAUSE DROWSINESS 15 tablet Volney American, [place]     PDMP not  reviewed this encounter.   Merrie Roof [place], [place] 03/20/20 [phone]

## 2020-05-26 ENCOUNTER — Other Ambulatory Visit: Payer: Self-pay | Admitting: Obstetrics and Gynecology

## 2020-05-26 DIAGNOSIS — Z1231 Encounter for screening mammogram for malignant neoplasm of breast: Secondary | ICD-10-CM

## 2020-07-13 ENCOUNTER — Ambulatory Visit
Admission: RE | Admit: 2020-07-13 | Discharge: 2020-07-13 | Disposition: A | Discharge status: Home / Self Care | Payer: Managed Care, Other (non HMO) | Source: Ambulatory Visit | Attending: Obstetrics and Gynecology | Admitting: Obstetrics and Gynecology

## 2020-07-13 ENCOUNTER — Other Ambulatory Visit: Payer: Self-pay

## 2020-07-13 DIAGNOSIS — Z1231 Encounter for screening mammogram for malignant neoplasm of breast: Secondary | ICD-10-CM

## 2020-07-17 ENCOUNTER — Ambulatory Visit: Payer: Managed Care, Other (non HMO)

## 2020-07-17 LAB — HM PAP SMEAR: HM Pap smear: NORMAL

## 2021-03-27 ENCOUNTER — Other Ambulatory Visit: Payer: Self-pay | Admitting: Obstetrics and Gynecology

## 2021-03-27 DIAGNOSIS — N644 Mastodynia: Secondary | ICD-10-CM

## 2021-04-24 ENCOUNTER — Ambulatory Visit: Payer: Managed Care, Other (non HMO) | Admitting: Nurse Practitioner

## 2021-04-30 ENCOUNTER — Other Ambulatory Visit: Payer: Managed Care, Other (non HMO)

## 2021-05-20 NOTE — Progress Notes (Signed)
? ?New Patient Office Visit ? ?Subjective   ? ?Patient ID: Bianca Bass, female    DOB: [birth year]  Age: 51 y.o. MRN: [id number] ? ?CC:  ?Chief Complaint  ?Patient presents with  ? Establish Care  ?  Np. Est care. Discuss weight management.   ? ? ?HPI ?Bianca Bass presents for new patient visit to establish care.  Introduced to Designer, jewellery role and practice setting.  All questions answered.  Discussed provider/patient relationship and expectations. ? ?She has recently gained weight since her mom died a year ago. She has tried weight watchers and keto diets. Today is the highest weight she has ever been. Her lowest weight was 185 and this is her goal to get down to. She notices that her blood pressure increases when her weight goes up. She loves to walk and does this 2-3 days per week.  ? 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  She has limited range of motion to her right shoulder.  She has an appointment with orthopedics this afternoon.   ? ?Depression and anxiety screen done: ? ? ?  05/21/2021  ?  1:25 PM  ?Depression screen PHQ 2/9  ?Decreased Interest 0  ?Down, Depressed, Hopeless 0  ?PHQ - 2 Score 0  ?Altered sleeping 0  ?Tired, decreased energy 0  ?Change in appetite 0  ?Feeling bad or failure about yourself  0  ?Trouble concentrating 0  ?Moving slowly or fidgety/restless 0  ?Suicidal thoughts 0  ?PHQ-9 Score 0  ?Difficult doing work/chores Not difficult at all  ? ? ?  05/21/2021  ?  1:25 PM  ?GAD 7 : Generalized Anxiety Score  ?Nervous, Anxious, on Edge 0  ?Control/stop worrying 0  ?Worry too much - different things 0  ?Trouble relaxing 0  ?Restless 0  ?Easily annoyed or irritable 0  ?Afraid - awful might happen 0  ?Total GAD 7 Score 0  ?Anxiety Difficulty Not difficult at all  ? ? ?Outpatient Encounter Medications as of 05/21/2021  ?Medication Sig  ? [DISCONTINUED] cyclobenzaprine (FLEXERIL) 5 MG tablet Take 1 tablet (5 mg  total) by mouth 3 (three) times daily as needed for muscle spasms. DO NOT DRINK ALCOHOL OR DRIVE WHILE TAKING THIS MEDICATION - MAY CAUSE DROWSINESS  ? [DISCONTINUED] meloxicam (MOBIC) 15 MG tablet Take 1 tablet (15 mg total) by mouth daily.  ? [DISCONTINUED] norethindrone (AYGESTIN) 5 MG tablet Take by mouth daily.  ? [DISCONTINUED] predniSONE (DELTASONE) 20 MG tablet Take 2 tablets (40 mg total) by mouth daily with breakfast.  ? ?No facility-administered encounter medications on file as of 05/21/2021.  ? ? ?Past Medical History:  ?Diagnosis Date  ? Complication of anesthesia   ? Fibroadenoma of breast, left   ? 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 PONV (postoperative nausea and vomiting)   ? ? ?Past Surgical History:  ?Procedure Laterality Date  ? BREAST BIOPSY Left   ? 3  ? BREAST BIOPSY Right   ? 1  ? BREAST EXCISIONAL BIOPSY Left 05/17/2016  ? BREAST LUMPECTOMY WITH RADIOACTIVE SEED LOCALIZATION Left 05/17/2016  ? Procedure: LEFT BREAST LUMPECTOMY WITH RADIOACTIVE SEED LOCALIZATION;  Surgeon: Fanny Skates, MD;  Location: [facility];  Service: General;  Laterality: Left;  ? DILATION AND CURETTAGE OF UTERUS    ? ENDOMETRIAL ABLATION  2016  ? KNEE ARTHROSCOPY Bilateral   ? TUBAL LIGATION    ? ? ?Family History  ?  Problem Relation Age of Onset  ? Diabetes Mother   ? Multiple sclerosis Mother   ? Diabetes Father   ? Breast cancer Neg Hx   ? ? ?Social History  ? ?Socioeconomic History  ? Marital status: Married  ?  Spouse name: Not on file  ? Number of children: Not on file  ? Years of education: Not on file  ? Highest education level: Not on file  ?Occupational History  ? Not on file  ?Tobacco Use  ? Smoking status: Never  ? Smokeless tobacco: Never  ?Vaping Use  ? Vaping Use: Never used  ?Substance and Sexual Activity  ? Alcohol use: No  ? Drug use: No  ? Sexual activity: Not on file  ?  Comment: endometrial ablation  ?Other Topics Concern  ? Not on file  ?Social History Narrative  ? Not on file   ? ?Social Determinants of Health  ? ?Financial Resource Strain: Not on file  ?Food Insecurity: Not on file  ?Transportation Needs: Not on file  ?Physical Activity: Not on file  ?Stress: Not on file  ?Social Connections: Not on file  ?Intimate Partner Violence: Not on file  ? ? ?Review of Systems  ?Constitutional:  Positive for malaise/fatigue. Negative for fever.  ?HENT: Negative.    ?Eyes: Negative.   ?Respiratory: Negative.    ?Cardiovascular: Negative.   ?Gastrointestinal: Negative.   ?Genitourinary: Negative.   ?Musculoskeletal:  Positive for joint pain (right shoulder).  ?Skin: Negative.   ?Neurological: Negative.   ?Psychiatric/Behavioral: Negative.    ? ?  ?Objective   ? ?BP (!) 138/94 (BP Location: Left Arm, Cuff Size: Large)   Pulse 77   Temp (!) 97.1 ?F (36.2 ?C) (Temporal)   Ht 5' 8.5" (1.74 m)   Wt 240 lb 3.2 oz (109 kg)   SpO2 99%   BMI 35.99 kg/m?  ? ?Physical Exam ?Vitals and nursing note reviewed.  ?Constitutional:   ?   General: She is not in acute distress. ?   Appearance: Normal appearance.  ?HENT:  ?   Head: Normocephalic and atraumatic.  ?   Right Ear: Tympanic membrane, ear canal and external ear normal.  ?   Left Ear: Tympanic membrane, ear canal and external ear normal.  ?   Nose: Nose normal.  ?   Mouth/Throat:  ?   Mouth: Mucous membranes are moist.  ?   Pharynx: Oropharynx is clear.  ?Eyes:  ?   Conjunctiva/sclera: Conjunctivae normal.  ?Cardiovascular:  ?   Rate and Rhythm: Normal rate and regular rhythm.  ?   Pulses: Normal pulses.  ?   Heart sounds: Normal heart sounds.  ?Pulmonary:  ?   Effort: Pulmonary effort is normal.  ?   Breath sounds: Normal breath sounds.  ?Abdominal:  ?   General: Bowel sounds are normal.  ?   Palpations: Abdomen is soft.  ?   Tenderness: There is no abdominal tenderness.  ?Musculoskeletal:  ?   Cervical back: Normal range of motion and neck supple. No tenderness.  ?   Right lower leg: No edema.  ?   Left lower leg: No edema.  ?   Comments: Limited ROM  to right shoulder  ?Lymphadenopathy:  ?   Cervical: No cervical adenopathy.  ?Skin: ?   General: Skin is warm and dry.  ?Neurological:  ?   General: No focal deficit present.  ?   Mental Status: She is alert and oriented to person, place, and time.  ?  Cranial Nerves: No cranial nerve deficit.  ?   Coordination: Coordination normal.  ?   Gait: Gait normal.  ?Psychiatric:     ?   Mood and Affect: Mood normal.     ?   Behavior: Behavior normal.     ?   Thought Content: Thought content normal.     ?   Judgment: Judgment normal.  ? ?Assessment & Plan:  ? ?Problem List Items Addressed This Visit   ? ?  ? Other  ? Obesity (BMI 30-39.9)  ?  BMI is 35.9 today.  Discussed diet and exercise.  She states that she knows what to do for eating, however she has a hard time doing it.  She states that she is usually eats when she is bored.  Discussed finding things to do like going for a short walk if she does get the urge to eat and she is not hungry.  We also discussed medication options to help with weight loss.  She is interested in this and will reach out to her health insurance to see if she has any medications that are covered.  Goal is to lose 1 to 2 pounds per week.  Follow-up in 3 months. ? ?  ?  ? ?Other Visit Diagnoses   ? ? Routine general medical examination at a health care facility    -  Primary  ? Health maintenance reviewed and updated.  She gets labs done yearly at work, last [place], CBC reviewed from 06/2020.  ? Screen for colon cancer      ? Discussed various options of colorectal cancer screening and she opts for colonoscopy.  Referral placed to GI.  ? Relevant Orders  ? Ambulatory referral to Gastroenterology  ? ?  ? ?LABORATORY TESTING:  ?- Pap smear: done elsewhere ? ?IMMUNIZATIONS:   ?- Tdap: Tetanus vaccination status reviewed: declined today. ?- Influenza: Postponed to flu season ?- Pneumovax: Not applicable ?- Prevnar: Not applicable ?- HPV: Not applicable ?- Zostavax vaccine:  Refused ? ?SCREENING: ?-Mammogram: Up to date  ?- Colonoscopy: Ordered today  ?- Bone Density: Not applicable  ?-Hearing Test: Not applicable  ?-Spirometry: Not applicable  ? ?PATIENT COUNSELING:   ?Advised to take 1 mg of folate supplement

## 2021-05-21 ENCOUNTER — Ambulatory Visit: Payer: Managed Care, Other (non HMO) | Admitting: Nurse Practitioner

## 2021-05-21 ENCOUNTER — Telehealth: Payer: Self-pay | Admitting: Nurse Practitioner

## 2021-05-21 ENCOUNTER — Encounter: Payer: Self-pay | Admitting: Nurse Practitioner

## 2021-05-21 VITALS — BP 138/94 | HR 77 | Temp 97.1°F | Ht 68.5 in | Wt 240.2 lb

## 2021-05-21 DIAGNOSIS — Z0000 Encounter for general adult medical examination without abnormal findings: Secondary | ICD-10-CM

## 2021-05-21 DIAGNOSIS — E669 Obesity, unspecified: Secondary | ICD-10-CM | POA: Diagnosis not present

## 2021-05-21 DIAGNOSIS — Z1211 Encounter for screening for malignant neoplasm of colon: Secondary | ICD-10-CM

## 2021-05-21 MED ORDER — WEGOVY 0.25 MG/0.5ML ~~LOC~~ SOAJ: 0.2500 mg | SUBCUTANEOUS | 0 refills | Status: DC

## 2021-05-21 NOTE — Patient Instructions (Addendum)
It was great to see you! ? ?I recommend you call your insurance company, the number should be on the back of your card, and ask if any weight loss medications are covered. Here is a list of medications:  ? ?Contrave - pill ?Semaglutide (Ozempic) - Injection ?Semaglutide [facility]) - Injection ?Liraglutide (Saxenda) - Injection ?Liraglutide (Victoza) - Injection  ? ?I also recommend continued focus on diet and nutrition along with exercise. You can use free apps like "My Fitness Pal" or "Lose It" on your smart phone to track your food. Try to limit your carbs to 150-200g daily. ? ?Let's follow-up in 3 months, sooner if you have concerns. ? ?If a referral was placed today, you will be contacted for an appointment. Please note that routine referrals can sometimes take up to 3-4 weeks to process. Please call our office if you haven't heard anything after this time frame. ? ?Take care, ? ?Vance Peper, NP ? ?

## 2021-05-21 NOTE — Assessment & Plan Note (Signed)
BMI is 35.9 today.  Discussed diet and exercise.  She states that she knows what to do for eating, however she has a hard time doing it.  She states that she is usually eats when she is bored.  Discussed finding things to do like going for a short walk if she does get the urge to eat and she is not hungry.  We also discussed medication options to help with weight loss.  She is interested in this and will reach out to her health insurance to see if she has any medications that are covered.  Goal is to lose 1 to 2 pounds per week.  Follow-up in 3 months. ?

## 2021-05-21 NOTE — Telephone Encounter (Signed)
FYI:Pt is calling back with #'s for her PA for [facility] phone [phone] and fax [phone]. ?

## 2021-05-24 NOTE — Telephone Encounter (Signed)
Pt called to check on PA for this to see if its been approved. Callback [phone] ?

## 2021-05-25 NOTE — Telephone Encounter (Signed)
Called and confirm pharmacy ins info w/ [employer] rx. Also called pharmacy to update patient's ins to process [facility] medication. PA request will be faxed to office. Sw, cma ?

## 2021-05-30 ENCOUNTER — Telehealth: Payer: Self-pay | Admitting: Nurse Practitioner

## 2021-05-30 NOTE — Telephone Encounter (Signed)
Pt called again to check on PA for [facility]. I told her it was in process. ?

## 2021-06-27 ENCOUNTER — Telehealth: Payer: Self-pay | Admitting: Nurse Practitioner

## 2021-06-27 DIAGNOSIS — E669 Obesity, unspecified: Secondary | ICD-10-CM

## 2021-06-27 MED ORDER — WEGOVY 0.25 MG/0.5ML ~~LOC~~ SOAJ: 0.2500 mg | SUBCUTANEOUS | 0 refills | Status: DC

## 2021-06-27 NOTE — Telephone Encounter (Signed)
Chart supports rx. Last OV: 05/21/2021 Next OV: 08/20/2021

## 2021-06-27 NOTE — Telephone Encounter (Signed)
Pt needs her Mancel Parsons refilled she has used her last dose.

## 2021-07-14 IMAGING — DX DG HAND COMPLETE 3+V*R*
3 series · 3 of 3 positions shown · non-contrast
Comparison: None.

CLINICAL DATA: Hand pain, possible injury while bowling

EXAM:
RIGHT HAND - COMPLETE 3+ VIEW

[hand pa]
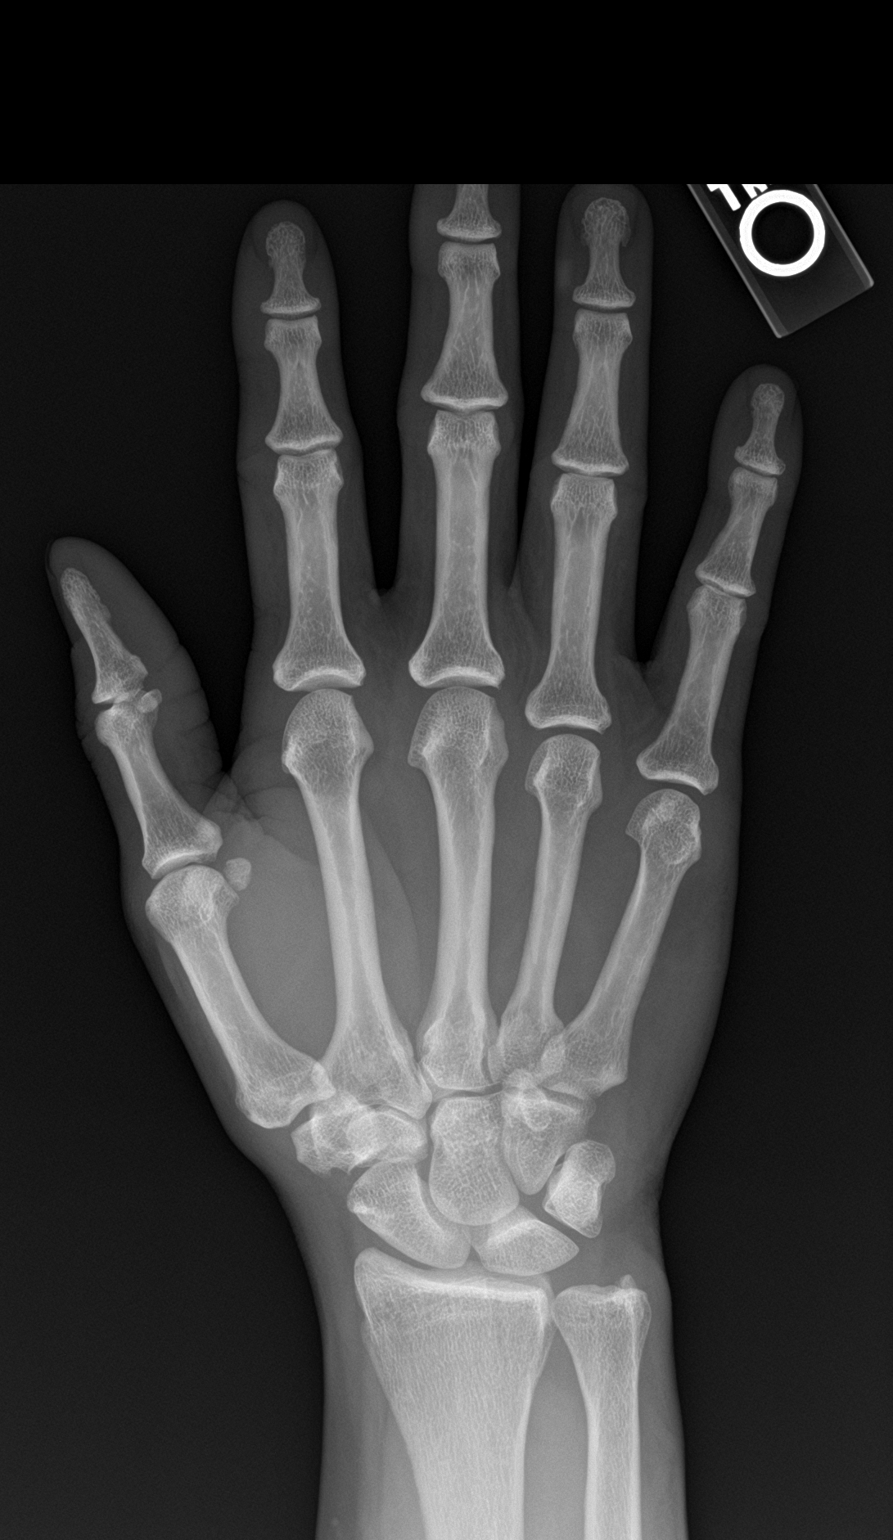

[hand obl]
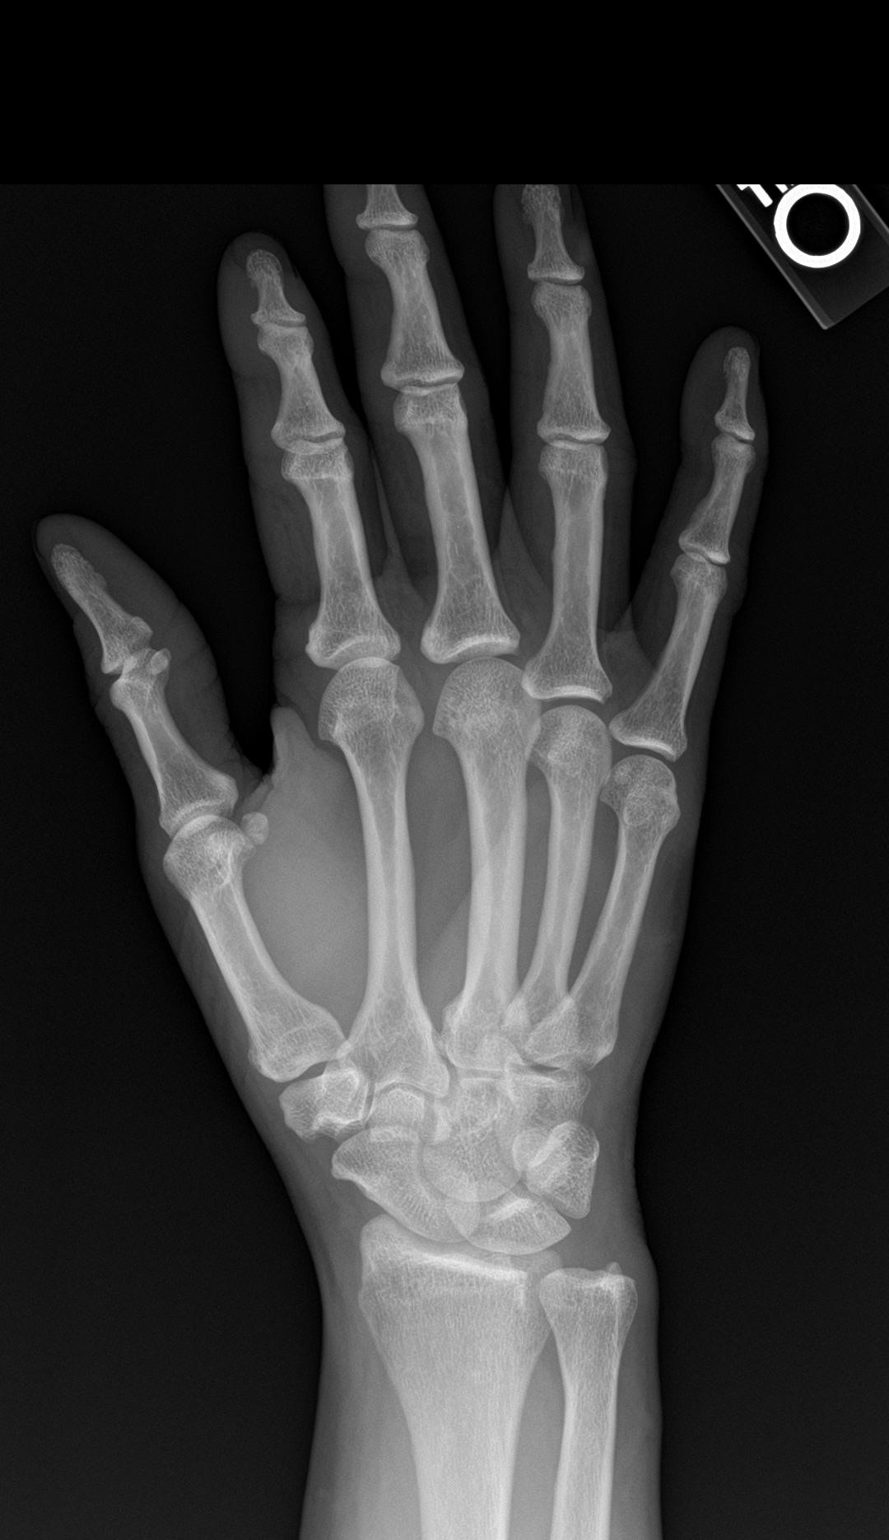

[hand lat]
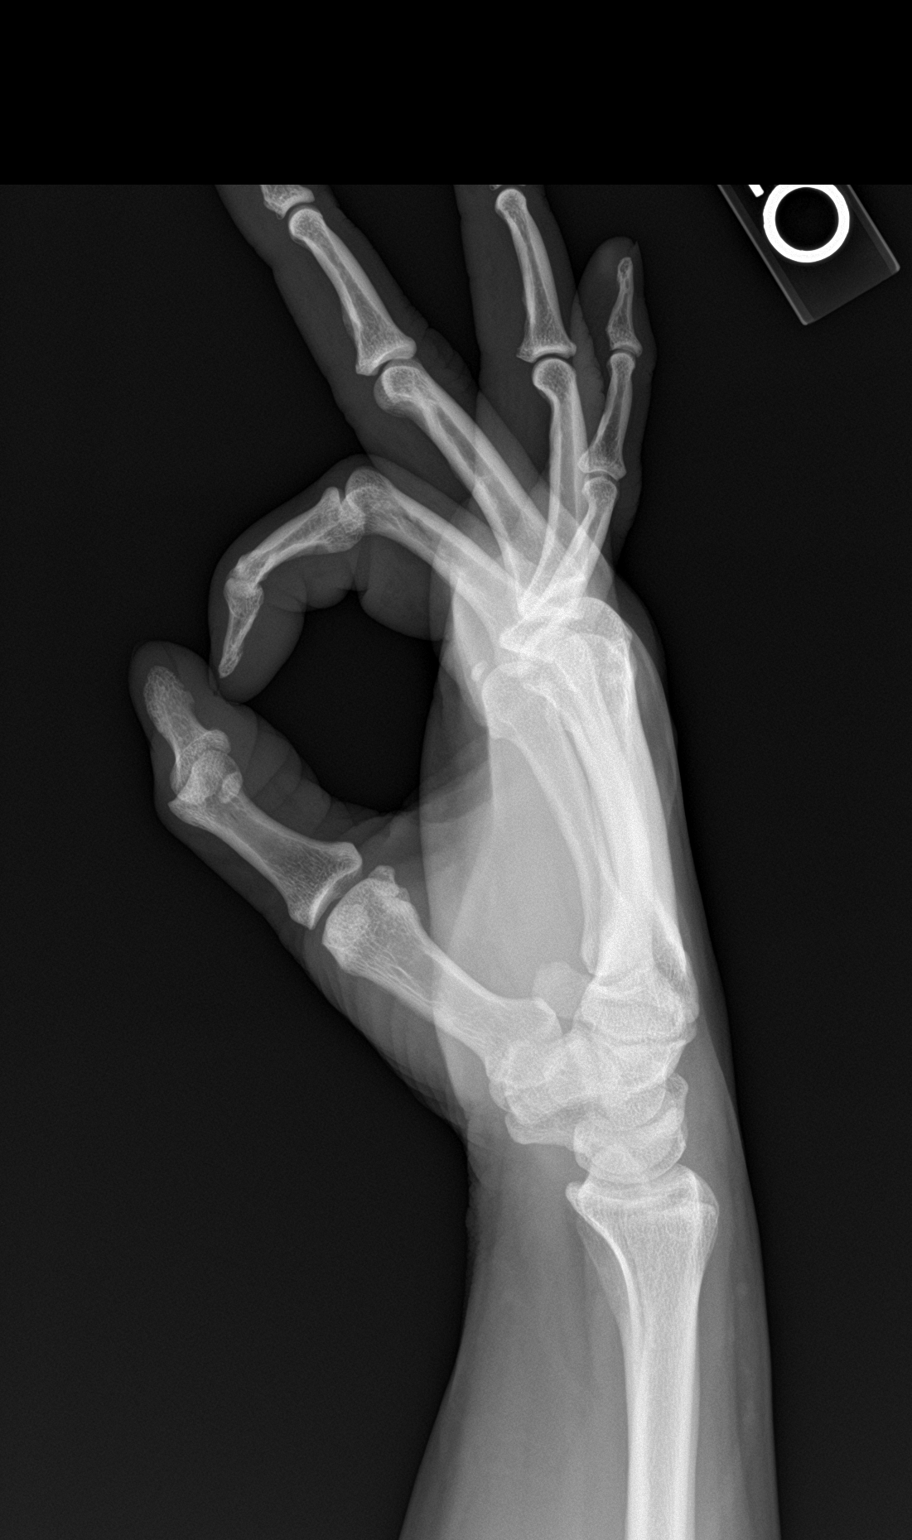

[3 of 3 positions shown; findings below may reference images not displayed]

FINDINGS: There is no evidence of fracture or dislocation. There is no
evidence of arthropathy or other focal bone abnormality. Soft
tissues are unremarkable.
IMPRESSION: Negative.

## 2021-07-17 ENCOUNTER — Telehealth: Payer: Self-pay | Admitting: Nurse Practitioner

## 2021-07-17 NOTE — Telephone Encounter (Signed)
Pt said Wegovy is on back order for 3 months. She wants to switch to Bianca Bass It is in stock. She is on her last injection. Please advise

## 2021-07-19 ENCOUNTER — Telehealth: Payer: Self-pay | Admitting: Nurse Practitioner

## 2021-07-19 NOTE — Telephone Encounter (Signed)
Pt stated that wegovy medication will be out of stock for 2-3 months and wanted to know if you get get her magiero or [employer]

## 2021-07-20 MED ORDER — TIRZEPATIDE 2.5 MG/0.5ML ~~LOC~~ SOAJ: 2.5000 mg | SUBCUTANEOUS | 0 refills | Status: DC

## 2021-08-06 ENCOUNTER — Telehealth: Payer: Self-pay | Admitting: Nurse Practitioner

## 2021-08-06 NOTE — Telephone Encounter (Signed)
Pt would like a call back concerning what else she can do to lose weight. Please advise pt at [phone]

## 2021-08-07 NOTE — Telephone Encounter (Signed)
Called and ldvm including provider comments. Pt can cb if any further questions or concerns.

## 2021-08-20 ENCOUNTER — Ambulatory Visit (INDEPENDENT_AMBULATORY_CARE_PROVIDER_SITE_OTHER): Payer: Managed Care, Other (non HMO) | Admitting: Nurse Practitioner

## 2021-08-20 ENCOUNTER — Telehealth: Payer: Self-pay | Admitting: Nurse Practitioner

## 2021-08-20 ENCOUNTER — Encounter: Payer: Self-pay | Admitting: Nurse Practitioner

## 2021-08-20 VITALS — BP 130/80 | HR 69 | Temp 96.8°F | Wt 226.8 lb

## 2021-08-20 DIAGNOSIS — E669 Obesity, unspecified: Secondary | ICD-10-CM

## 2021-08-20 DIAGNOSIS — G8929 Other chronic pain: Secondary | ICD-10-CM | POA: Diagnosis not present

## 2021-08-20 DIAGNOSIS — M545 Low back pain, unspecified: Secondary | ICD-10-CM

## 2021-08-20 LAB — POCT URINALYSIS DIPSTICK
Bilirubin, UA: NEGATIVE
Blood, UA: NEGATIVE
Glucose, UA: NEGATIVE
Ketones, UA: NEGATIVE
Leukocytes, UA: NEGATIVE
Nitrite, UA: NEGATIVE
Protein, UA: NEGATIVE
Spec Grav, UA: <=1.005 — AB (ref 1.010–1.025)
Urobilinogen, UA: 0.2 E.U./dL
pH, UA: 6.5 (ref 5.0–8.0)

## 2021-08-20 MED ORDER — PHENTERMINE-TOPIRAMATE 3.75-23 MG PO CP24
1.0000 | ORAL_CAPSULE | Freq: Every day | ORAL | 0 refills | Status: DC
Start: 2021-08-20 — End: 2021-11-26

## 2021-08-20 NOTE — Patient Instructions (Addendum)
This was sent regarding your referral for colonoscopy:   Good Morning Bianca Bass,   I am reaching out because your Primary Care Provider sent a referral for an appointment at [facility].   Please call our office at [phone] and choose option 1, to get this appointment scheduled.    Thank you, [facility].    Keep up the great work with your weight loss! Start Qsymia 1 capsule daily. Let's follow-up in 1 month after starting this medication, can be virtual.

## 2021-08-20 NOTE — Assessment & Plan Note (Signed)
Chronic, slightly worsened recently. U/A in office negative. Most likely musculoskeletal. She can continue the heating pad and also take ibuprofen or tylenol as needed for pain. Follow-up if symptoms worsen or don't improve.

## 2021-08-20 NOTE — Assessment & Plan Note (Signed)
She has lost 14 pounds since her last visit, congratulated her on this! She has been out of her Mancel Parsons for the last month and would like to start a different medication since [facility] is on backorder. She is not interested in [place], because it is a weekly injection. Will have her start qsymia daily. She is currently menopausal and has not had a menstrual period in 6 years. Discussed possible side effects. Follow-up in 4 weeks.

## 2021-08-20 NOTE — Progress Notes (Signed)
Established Patient Office Visit  Subjective   Patient ID: Bianca Bass, female    DOB: 06-02-1970  Age: 51 y.o. MRN: [id number]  Chief Complaint  Patient presents with   Follow-up    3 mo f/u weight management/ weight loss    HPI  Bianca Bass is here to follow-up on weight management. She has taken [facility] 0.'25mg'$  injections and then hasn't been able to get them from being on backorder. She has been trying to limit portion sizes and increase exercise. She is still looking for a medication that can help with weight loss.   She has noticed low back pain for the past few months. She states that it worsened recently in the past few weeks. She is concerned for a possible kidney infection. She denies dysuria, urinary frequency, and hematuria.  She has been using a heating pad which helps her symptoms.     ROS See pertinent positives and negatives per HPI.    Objective:     BP 130/80 (BP Location: Right Arm, Cuff Size: Large)   Pulse 69   Temp (!) 96.8 F (36 C) (Temporal)   Wt 226 lb 12.8 oz (102.9 kg)   SpO2 97%   BMI 33.98 kg/m  Wt Readings from Last 3 Encounters:  08/20/21 226 lb 12.8 oz (102.9 kg)  05/21/21 240 lb 3.2 oz (109 kg)  05/16/17 235 lb (106.6 kg)     Physical Exam General: Alert and oriented x4 Pulmonary: Lungs clear to auscultation bilaterally Cardiovascular: regular rate and rhythm  Abdomen: No tenderness to palpation GU: No CVA tenderness  Results for orders placed or performed in visit on 08/20/21  POCT urinalysis dipstick  Result Value Ref Range   Color, UA pale yellow    Clarity, UA clear    Glucose, UA Negative Negative   Bilirubin, UA negative    Ketones, UA negative    Spec Grav, UA <=1.005 (A) 1.010 - 1.025   Blood, UA negative    pH, UA 6.5 5.0 - 8.0   Protein, UA Negative Negative   Urobilinogen, UA 0.2 0.2 or 1.0 E.U./dL   Nitrite, UA negative    Leukocytes, UA Negative Negative   Appearance     Odor        Assessment & Plan:   Problem List Items Addressed This Visit       Other   Obesity (BMI 30-39.9)    She has lost 14 pounds since her last visit, congratulated her on this! She has been out of her Mancel Parsons for the last month and would like to start a different medication since [facility] is on backorder. She is not interested in [place], because it is a weekly injection. Will have her start qsymia daily. She is currently menopausal and has not had a menstrual period in 6 years. Discussed possible side effects. Follow-up in 4 weeks.       Relevant Medications   Phentermine-Topiramate 3.75-23 MG CP24   Chronic right-sided low back pain without sciatica - Primary    Chronic, slightly worsened recently. U/A in office negative. Most likely musculoskeletal. She can continue the heating pad and also take ibuprofen or tylenol as needed for pain. Follow-up if symptoms worsen or don't improve.       Relevant Medications   methocarbamol (ROBAXIN) 500 MG tablet   Other Relevant Orders   POCT urinalysis dipstick (Completed)     Return in about 4 weeks (around 09/17/2021) for weight management, can be virtual .  Charyl Dancer, NP

## 2021-08-20 NOTE — Telephone Encounter (Signed)
Pt called and said the pharmacy need a pre authorization for the med Phentermine-Topiramate 3.75-23

## 2021-08-21 NOTE — Telephone Encounter (Signed)
error 

## 2021-08-23 ENCOUNTER — Encounter: Payer: Self-pay | Admitting: Nurse Practitioner

## 2021-08-27 ENCOUNTER — Telehealth: Payer: Self-pay

## 2021-08-27 ENCOUNTER — Telehealth: Payer: Self-pay | Admitting: Nurse Practitioner

## 2021-08-27 NOTE — Telephone Encounter (Signed)
Caller Name: Bianca Bass Call back phone #: [phone]  Reason for Call: Pt requested that we give a call to her insurance to give PA for the medication Phentermine

## 2021-08-27 NOTE — Telephone Encounter (Signed)
PA for Qsymia 3.75- 23 mg  Submitted through cover my meds .  Awaiting response.   Dm/cma  Key: [id number]

## 2021-08-27 NOTE — Telephone Encounter (Signed)
PA for Qsymia 3.75- 23 mg  Submitted through cover my meds .  Awaiting response.   Dm/cma  Key: [id number]

## 2021-08-28 NOTE — Telephone Encounter (Signed)
PA denied due to no meeting clinical requirements. Dm/cma

## 2021-08-29 ENCOUNTER — Encounter: Payer: Self-pay | Admitting: Nurse Practitioner

## 2021-09-18 ENCOUNTER — Other Ambulatory Visit: Payer: Self-pay | Admitting: Obstetrics and Gynecology

## 2021-09-18 DIAGNOSIS — Z1231 Encounter for screening mammogram for malignant neoplasm of breast: Secondary | ICD-10-CM

## 2021-09-24 NOTE — Telephone Encounter (Signed)
Na

## 2021-10-10 ENCOUNTER — Ambulatory Visit: Payer: Managed Care, Other (non HMO)

## 2021-10-16 ENCOUNTER — Ambulatory Visit: Payer: Managed Care, Other (non HMO)

## 2021-10-29 ENCOUNTER — Other Ambulatory Visit: Payer: Self-pay | Admitting: Obstetrics and Gynecology

## 2021-10-29 DIAGNOSIS — N644 Mastodynia: Secondary | ICD-10-CM

## 2021-11-09 ENCOUNTER — Encounter: Payer: Self-pay | Admitting: Gastroenterology

## 2021-11-10 IMAGING — MG MM DIGITAL SCREENING BILAT W/ TOMO AND CAD
6 of 10 series · 6 of 30 positions shown · non-contrast
Comparison: Previous exam(s).

CLINICAL DATA: Screening.

EXAM:
DIGITAL SCREENING BILATERAL MAMMOGRAM WITH TOMOSYNTHESIS AND CAD
TECHNIQUE: Bilateral screening digital craniocaudal and mediolateral oblique
mammograms were obtained. Bilateral screening digital breast
tomosynthesis was performed. The images were evaluated with
computer-aided detection.

[R MLO synth-2D (1 of 2)]
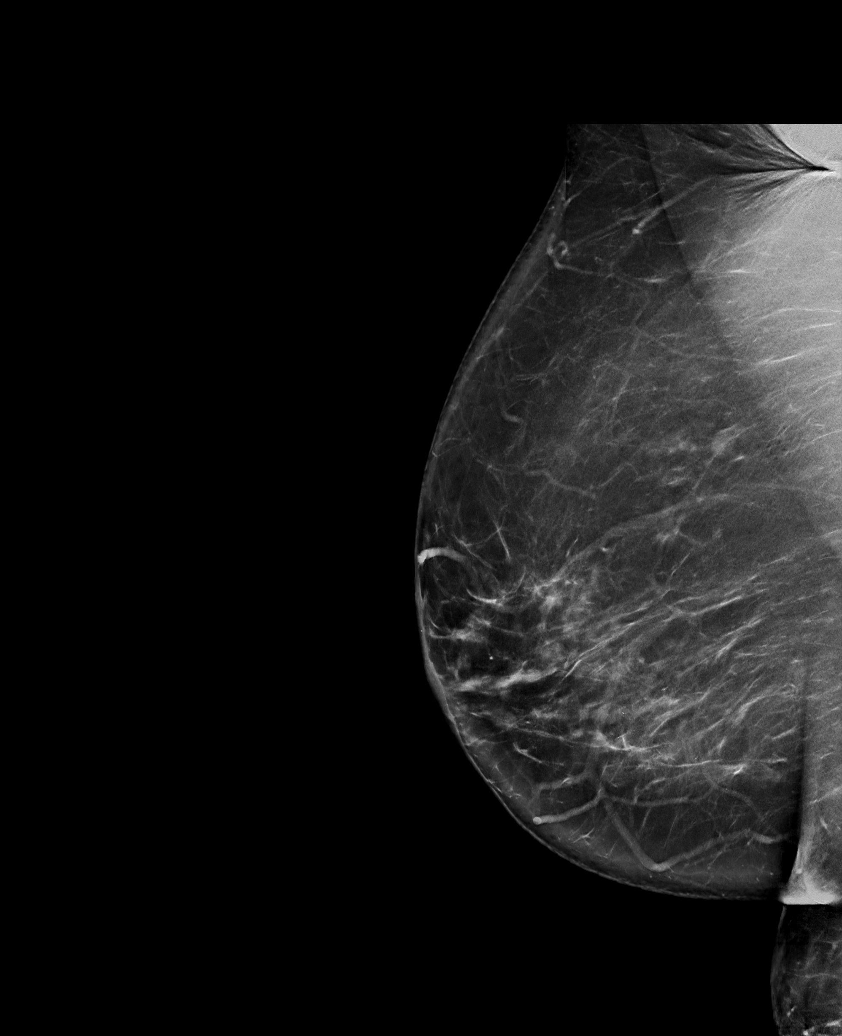

[R MLO synth-2D (2 of 2)]
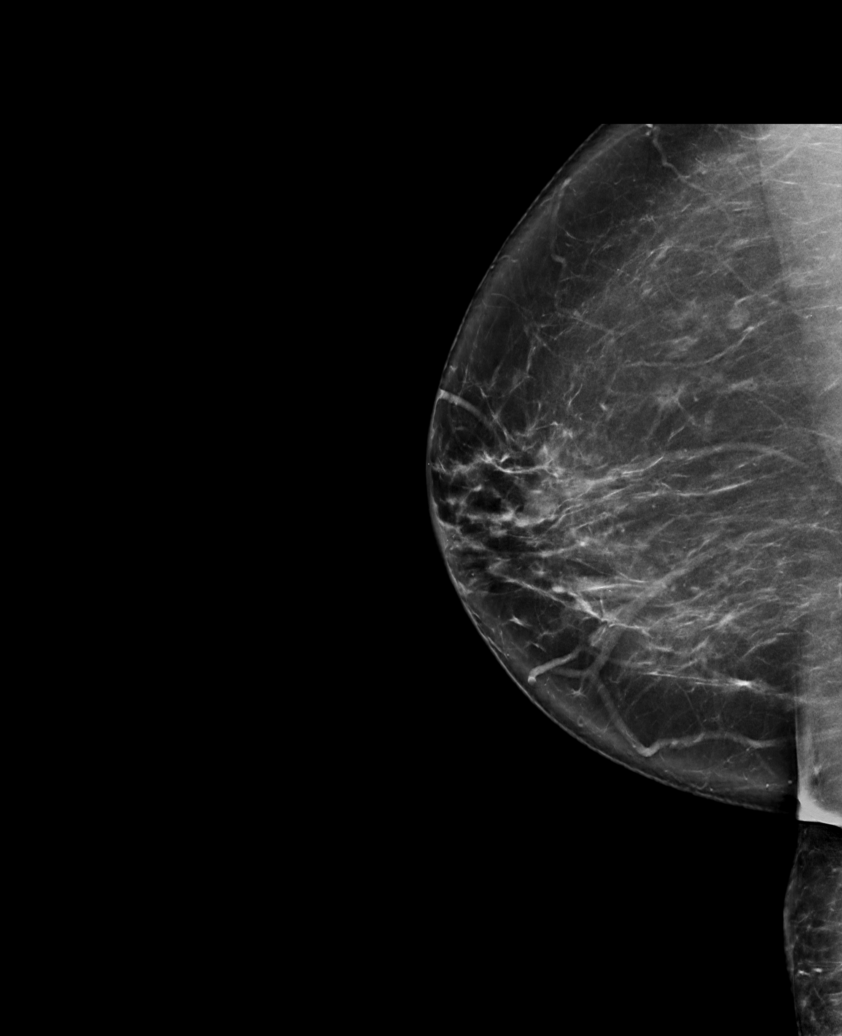

[L CC synth-2D]
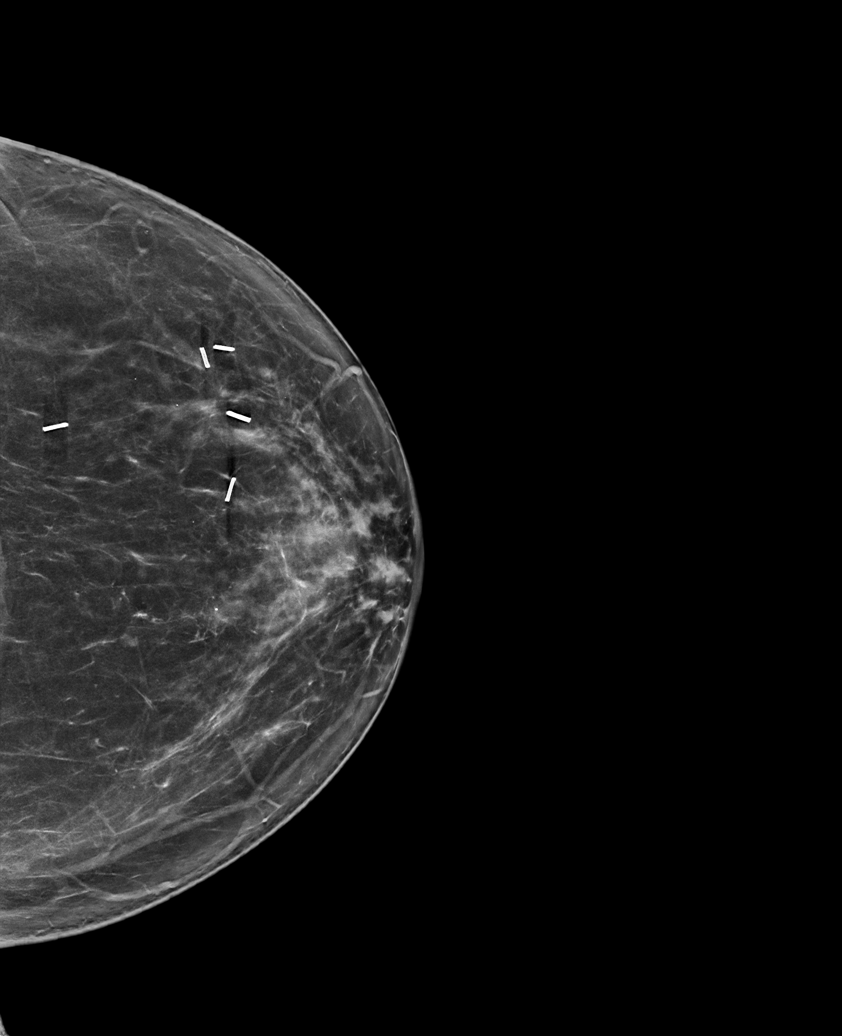

[L MLO synth-2D]
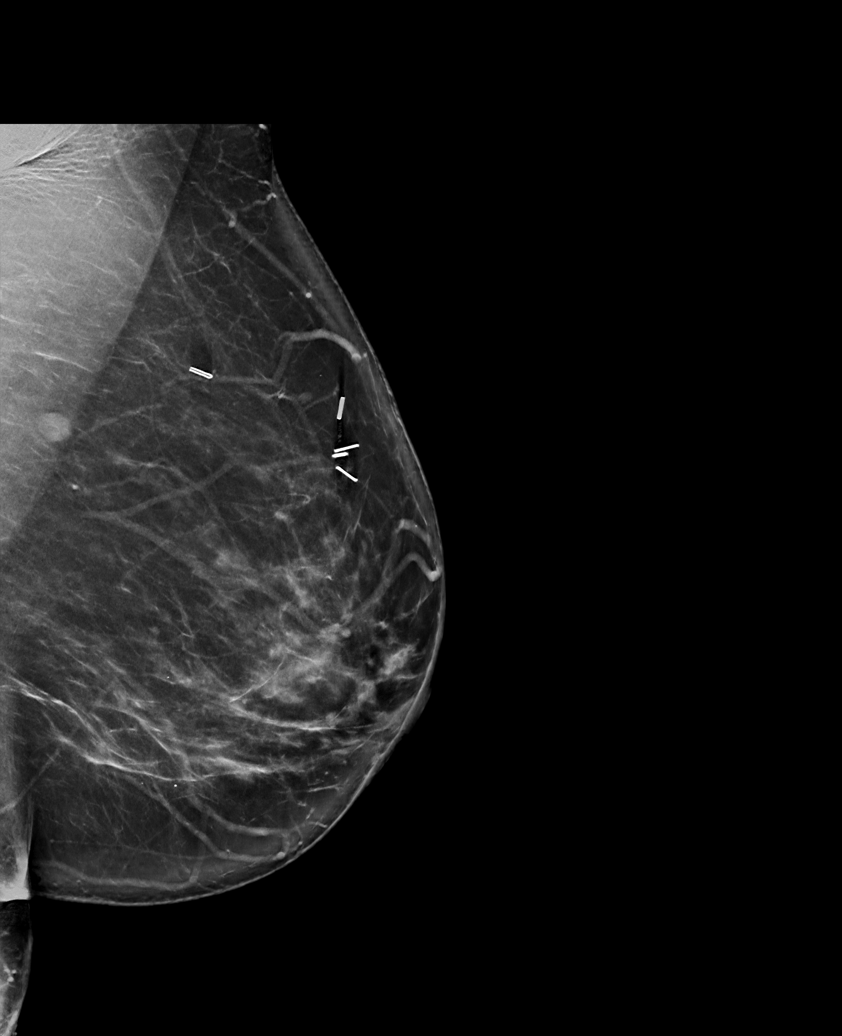

[R CC synth-2D]
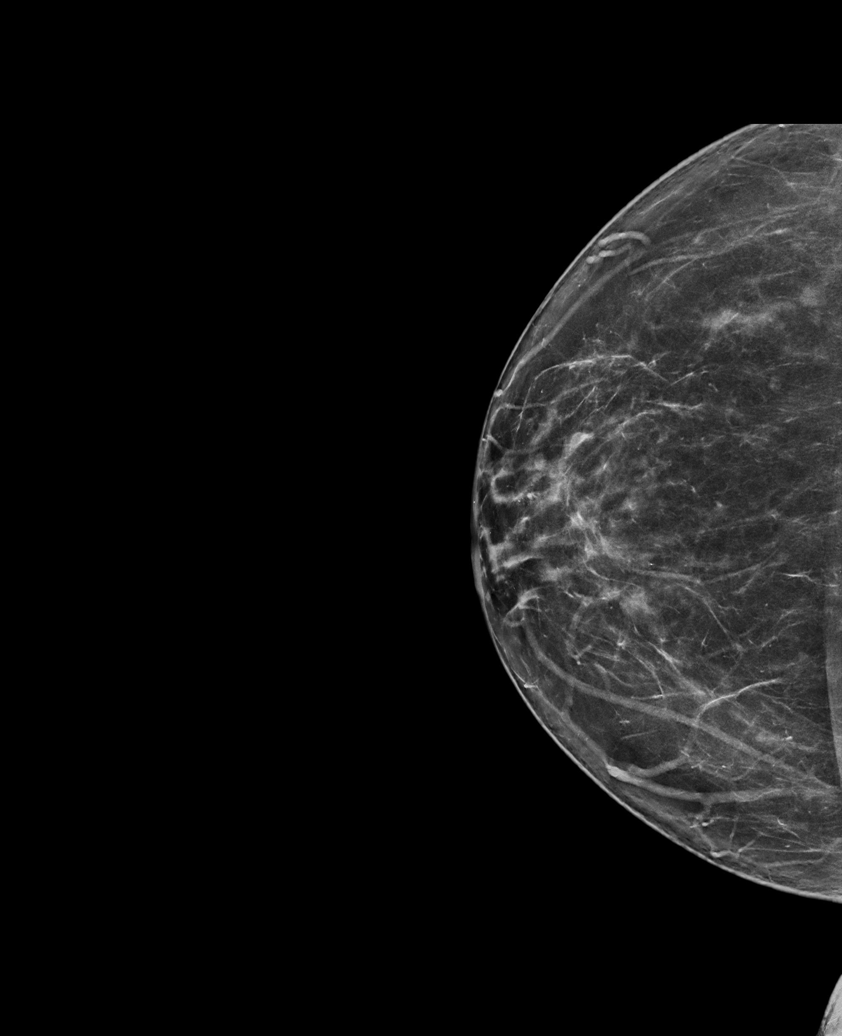

[L MLO tomo · tomo slice 46/91.0]
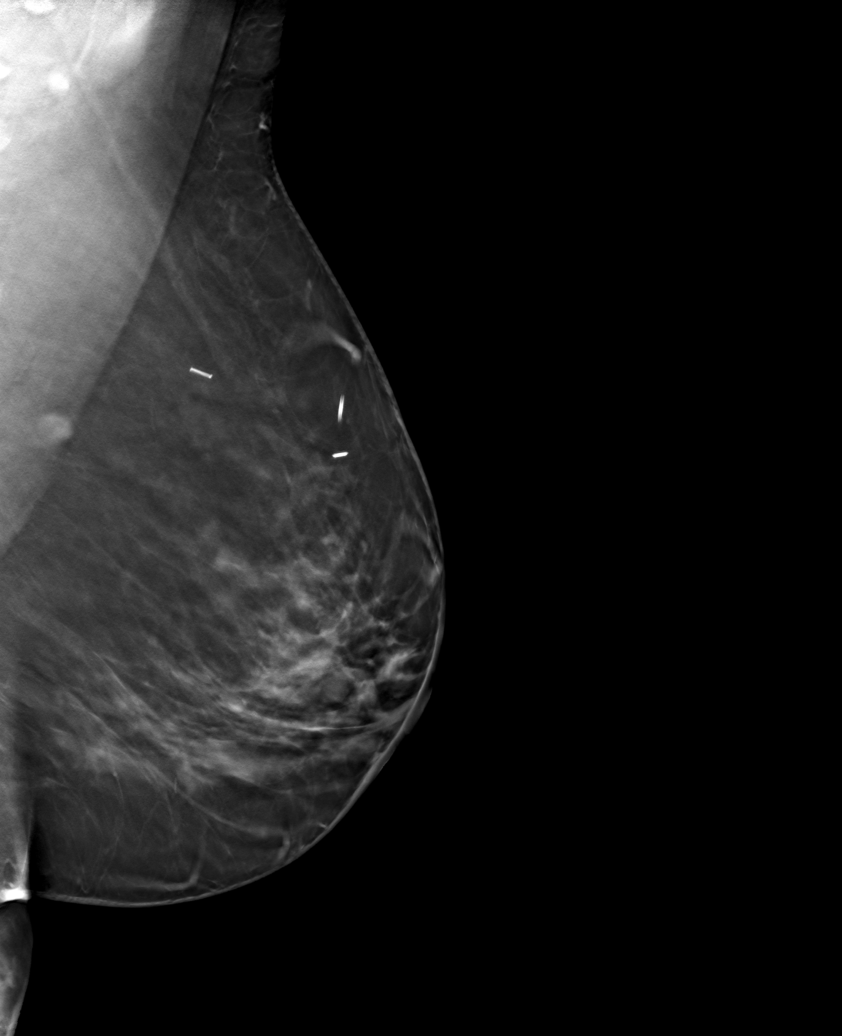

[6 of 30 positions shown; findings below may reference images not displayed]

ACR Breast Density Category c: The breast tissue is heterogeneously
dense, which may obscure small masses.
FINDINGS: There are no findings suspicious for malignancy.
IMPRESSION: No mammographic evidence of malignancy. A result letter of this
screening mammogram will be mailed directly to the patient.

RECOMMENDATION:
Screening mammogram in one year. (Code:[id number]-BC3)

BI-RADS CATEGORY  1: Negative.

## 2021-11-12 ENCOUNTER — Ambulatory Visit
Admission: RE | Admit: 2021-11-12 | Discharge: 2021-11-12 | Disposition: A | Discharge status: Home / Self Care | Payer: Managed Care, Other (non HMO) | Source: Ambulatory Visit | Attending: Obstetrics and Gynecology | Admitting: Obstetrics and Gynecology

## 2021-11-12 ENCOUNTER — Ambulatory Visit: Payer: Managed Care, Other (non HMO)

## 2021-11-12 DIAGNOSIS — N644 Mastodynia: Secondary | ICD-10-CM

## 2021-11-26 ENCOUNTER — Ambulatory Visit: Payer: Managed Care, Other (non HMO) | Admitting: Nurse Practitioner

## 2021-11-26 ENCOUNTER — Encounter: Payer: Self-pay | Admitting: Nurse Practitioner

## 2021-11-26 VITALS — BP 134/88 | HR 68 | Temp 97.0°F | Wt 230.6 lb

## 2021-11-26 DIAGNOSIS — E669 Obesity, unspecified: Secondary | ICD-10-CM | POA: Diagnosis not present

## 2021-11-26 DIAGNOSIS — M545 Low back pain, unspecified: Secondary | ICD-10-CM | POA: Diagnosis not present

## 2021-11-26 DIAGNOSIS — R232 Flushing: Secondary | ICD-10-CM

## 2021-11-26 DIAGNOSIS — G8929 Other chronic pain: Secondary | ICD-10-CM

## 2021-11-26 MED ORDER — PHENTERMINE HCL 15 MG PO CAPS: 15.0000 mg | ORAL_CAPSULE | ORAL | 0 refills | Status: DC

## 2021-11-26 NOTE — Patient Instructions (Signed)
It was great to see you!  Start phentermine 1 tablet daily in the morning before you eat.   You can try black kohosh supplement over the counter help with hot flashes.   Let's follow-up in 6-8 weeks, sooner if you have concerns.  If a referral was placed today, you will be contacted for an appointment. Please note that routine referrals can sometimes take up to 3-4 weeks to process. Please call our office if you haven't heard anything after this time frame.  Take care,  Vance Peper, NP

## 2021-11-26 NOTE — Assessment & Plan Note (Signed)
Chronic, stable. 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 She can continue meloxicam 7.'5mg'$  daily prn and continue collaboration with pain management.

## 2021-11-26 NOTE — Assessment & Plan Note (Signed)
Discussed that she can wear layers, and use a fan. She can try black kohosh over the counter as she is looking for a natural supplement. Discussed that there are some medications that can help with her symptoms as well. Follow-up in 6-8 weeks.

## 2021-11-26 NOTE — Progress Notes (Signed)
Established Patient Office Visit  Subjective   Patient ID: Bianca Bass, female    DOB: 1970/11/02  Age: 51 y.o. MRN: [id number]  Chief Complaint  Patient presents with   Follow-up    3 mo f/u     HPI  Bianca Bass is here to follow-up on right sided low back pain. Her GYN referred her to a pain specialist who referred her to PT. She also had x-rays done which showed bilateral chronic sacroiliitis and moderate arthritis in her pubic symphysis. She has been taking meloxicam 7.'5mg'$  as needed and states the pain is doing better. She states that some days are better than others, but overall it is well controlled.   She has gained 4 pounds since stopping the [facility] due to back order. Her insurance denied qysmia. She is wondering if there are any other weight loss medications available. She denies chest pain and shortness of breath.   She has also been experiencing hot flashes. She believes that she is starting to go through menopause. It happens several times throughout the day.     ROS See pertinent positives and negatives per HPI.    Objective:     BP 134/88 (BP Location: Right Arm, Patient Position: Sitting)   Pulse 68   Temp (!) 97 F (36.1 C) (Temporal)   Wt 230 lb 9.6 oz (104.6 kg)   SpO2 99%   BMI 34.55 kg/m  BP Readings from Last 3 Encounters:  11/26/21 134/88  08/20/21 130/80  05/21/21 (!) 138/94   Wt Readings from Last 3 Encounters:  11/26/21 230 lb 9.6 oz (104.6 kg)  08/20/21 226 lb 12.8 oz (102.9 kg)  05/21/21 240 lb 3.2 oz (109 kg)      Physical Exam Vitals and nursing note reviewed.  Constitutional:      General: She is not in acute distress.    Appearance: Normal appearance.  HENT:     Head: Normocephalic.  Eyes:     Conjunctiva/sclera: Conjunctivae normal.  Cardiovascular:     Rate and Rhythm: Normal rate and regular rhythm.     Pulses: Normal pulses.     Heart sounds: Normal heart sounds.  Pulmonary:     Effort: Pulmonary  effort is normal.     Breath sounds: Normal breath sounds.  Musculoskeletal:     Cervical back: Normal range of motion.  Skin:    General: Skin is warm.  Neurological:     General: No focal deficit present.     Mental Status: She is alert and oriented to person, place, and time.  Psychiatric:        Mood and Affect: Mood normal.        Behavior: Behavior normal.        Thought Content: Thought content normal.        Judgment: Judgment normal.      Assessment & Plan:   Problem List Items Addressed This Visit       Cardiovascular and Mediastinum   Hot flashes    Discussed that she can wear layers, and use a fan. She can try black kohosh over the counter as she is looking for a natural supplement. Discussed that there are some medications that can help with her symptoms as well. Follow-up in 6-8 weeks.         Other   Obesity (BMI 30-39.9)    She has gained 4 pounds since her last visit. Her insurance denied qsymia. Will start her on phentermine  $'15mg's$  daily. PDMP reviewed. Discussed possible side effects. Will switch her back to [facility] when it is back in stock. Discussed nutrition and exercise. Follow-up in 6-8 weeks.       Relevant Medications   phentermine 15 MG capsule   Chronic right-sided low back pain without sciatica - Primary    Chronic, stable. 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 She can continue meloxicam 7.'5mg'$  daily prn and continue collaboration with pain management.       Relevant Medications   meloxicam (MOBIC) 7.5 MG tablet    Return in about 8 weeks (around 01/21/2022) for weight management, hot flashes.    Charyl Dancer, NP

## 2021-11-26 NOTE — Assessment & Plan Note (Signed)
She has gained 4 pounds since her last visit. Her insurance denied qsymia. Will start her on phentermine '15mg'$  daily. PDMP reviewed. Discussed possible side effects. Will switch her back to [facility] when it is back in stock. Discussed nutrition and exercise. Follow-up in 6-8 weeks.

## 2021-12-10 ENCOUNTER — Ambulatory Visit (AMBULATORY_SURGERY_CENTER): Payer: Self-pay | Admitting: *Deleted

## 2021-12-10 VITALS — Ht 67.25 in | Wt 229.0 lb

## 2021-12-10 DIAGNOSIS — Z1211 Encounter for screening for malignant neoplasm of colon: Secondary | ICD-10-CM

## 2021-12-10 MED ORDER — NA SULFATE-K SULFATE-MG SULF 17.5-3.13-1.6 GM/177ML PO SOLN: 1.0000 | Freq: Once | ORAL | 0 refills | Status: AC

## 2021-12-10 NOTE — Progress Notes (Signed)
egg  MAKE  PT. VOMIT WHEN EAT BY THEMSELVES BUT ABLE TO EAT IN CAKE or soy allergy known to patient  No issues known to pt with past sedation with any surgeries or procedures Patient denies ever being told they had issues or difficulty with intubation  No FH of Malignant Hyperthermia Pt is not on diet pills Pt is not on  home 02  Pt is not on blood thinners  Pt denies issues with constipation  No A fib or A flutter Have any cardiac testing pending--NO Pt instructed to use Singlecare.com or GoodRx for a price reduction on prep    Patient's chart reviewed by Bianca Bass CNRA prior to previsit and patient appropriate for the [place].  Previsit completed and red dot placed by patient's name on their procedure day (on provider's schedule).

## 2022-01-02 ENCOUNTER — Encounter: Payer: Self-pay | Admitting: Gastroenterology

## 2022-01-07 ENCOUNTER — Ambulatory Visit: Payer: Managed Care, Other (non HMO) | Admitting: Gastroenterology

## 2022-01-07 ENCOUNTER — Encounter: Payer: Self-pay | Admitting: Gastroenterology

## 2022-01-07 VITALS — BP 99/60 | HR 61 | Temp 96.8°F | Resp 15 | Ht 67.25 in | Wt 229.0 lb

## 2022-01-07 DIAGNOSIS — Z1211 Encounter for screening for malignant neoplasm of colon: Secondary | ICD-10-CM

## 2022-01-07 MED ORDER — SODIUM CHLORIDE 0.9 % IV SOLN: 500.0000 mL | INTRAVENOUS | Status: DC

## 2022-01-07 NOTE — Op Note (Signed)
[facility] Patient Name: Bianca Bass Procedure Date: 01/07/2022 10:40 AM MRN: [id number] Endoscopist: Gerrit Heck , MD, [id number] Age: 51 Referring MD:  Date of Birth: 1970-03-29 Gender: Female Account #: [id number] Procedure:                Colonoscopy Indications:              Screening for colorectal malignant neoplasm, This                            is the patient's first colonoscopy Medicines:                Monitored Anesthesia Care Procedure:                Pre-Anesthesia Assessment:                           - Prior to the procedure, a History and Physical                            was performed, and patient medications and                            allergies were reviewed. The patient's tolerance of                            previous anesthesia was also reviewed. The risks                            and benefits of the procedure and the sedation                            options and risks were discussed with the patient.                            All questions were answered, and informed consent                            was obtained. Prior Anticoagulants: The patient has                            taken no anticoagulant or antiplatelet agents. ASA                            Grade Assessment: II - A patient with mild systemic                            disease. After reviewing the risks and benefits,                            the patient was deemed in satisfactory condition to                            undergo the procedure.  After obtaining informed consent, the colonoscope                            was passed under direct vision. Throughout the                            procedure, the patient's blood pressure, pulse, and                            oxygen saturations were monitored continuously. The                            CF [id number] #[id number] was introduced through the anus                            and advanced to  the the terminal ileum. The                            colonoscopy was performed without difficulty. The                            patient tolerated the procedure well. The quality                            of the bowel preparation was excellent. The                            terminal ileum, ileocecal valve, appendiceal                            orifice, and rectum were photographed. Scope In: 10:56:25 AM Scope Out: 11:07:59 AM Scope Withdrawal Time: 0 hours 9 minutes 24 seconds  Total Procedure Duration: 0 hours 11 minutes 34 seconds  Findings:                 The perianal and digital rectal examinations were                            normal.                           The entire colon appeared normal.                           The retroflexed view of the distal rectum and anal                            verge was normal and showed no anal or rectal                            abnormalities.                           The terminal ileum appeared normal. Complications:            No immediate complications. Estimated  Blood Loss:     Estimated blood loss: none. Impression:               - The entire examined colon is normal.                           - The distal rectum and anal verge are normal on                            retroflexion view.                           - The examined portion of the ileum was normal.                           - No specimens collected. Recommendation:           - Patient has a contact number available for                            emergencies. The signs and symptoms of potential                            delayed complications were discussed with the                            patient. Return to normal activities tomorrow.                            Written discharge instructions were provided to the                            patient.                           - Resume previous diet.                           - Continue present medications.                            - Repeat colonoscopy in 10 years for screening                            purposes.                           - Return to GI office PRN. Gerrit Heck, MD 01/07/2022 11:12:41 AM

## 2022-01-07 NOTE — Progress Notes (Signed)
GASTROENTEROLOGY PROCEDURE H&P NOTE   Primary Care Physician: Charyl Dancer, NP    Reason for Procedure:  Colon Cancer screening  Plan:    Colonoscopy  Patient is appropriate for endoscopic procedure(s) in the ambulatory ([place]) setting.  The nature of the procedure, as well as the risks, benefits, and alternatives were carefully and thoroughly reviewed with the patient. Ample time for discussion and questions allowed. The patient understood, was satisfied, and agreed to proceed.     HPI: Bianca Bass is a 51 y.o. female who presents for colonoscopy for routine Colon Cancer screening.  No active GI symptoms.  No known family history of colon cancer or related malignancy.  Patient is otherwise without complaints or active issues today.  Past Medical History:  Diagnosis Date   Complication of anesthesia    Fibroadenoma of breast, left    Fibroadenoma of left breast 05/17/2016   PONV (postoperative nausea and vomiting)     Past Surgical History:  Procedure Laterality Date   BREAST BIOPSY Left    3   BREAST BIOPSY Right    1   BREAST EXCISIONAL BIOPSY Left 05/17/2016   BREAST LUMPECTOMY WITH RADIOACTIVE SEED LOCALIZATION Left 05/17/2016   Procedure: LEFT BREAST LUMPECTOMY WITH RADIOACTIVE SEED LOCALIZATION;  Surgeon: Fanny Skates, MD;  Location: [facility];  Service: General;  Laterality: Left;   DILATION AND CURETTAGE OF UTERUS     ENDOMETRIAL ABLATION  2016   KNEE ARTHROSCOPY Bilateral    TUBAL LIGATION      Prior to Admission medications   Medication Sig Start Date End Date Taking? Authorizing Provider  fluticasone (FLOVENT HFA) 110 MCG/ACT inhaler     [provider]  meloxicam (MOBIC) 7.5 MG tablet Take by mouth as needed. 10/11/21   [provider]  phentermine 15 MG capsule Take 1 capsule (15 mg total) by mouth every morning. Patient not taking: Reported on 12/10/2021 11/26/21   Charyl Dancer, NP   Semaglutide-Weight Management (WEGOVY) 0.25 MG/0.5ML SOAJ Inject 0.25 mg into the skin once a week. Patient not taking: Reported on 12/10/2021 06/27/21   Charyl Dancer, NP    Current Outpatient Medications  Medication Sig Dispense Refill   fluticasone (FLOVENT HFA) 110 MCG/ACT inhaler  (Patient not taking: Reported on 12/10/2021)     meloxicam (MOBIC) 7.5 MG tablet Take by mouth as needed.     phentermine 15 MG capsule Take 1 capsule (15 mg total) by mouth every morning. (Patient not taking: Reported on 12/10/2021) 30 capsule 0   Semaglutide-Weight Management (WEGOVY) 0.25 MG/0.5ML SOAJ Inject 0.25 mg into the skin once a week. (Patient not taking: Reported on 12/10/2021) 2 mL 0   Current Facility-Administered Medications  Medication Dose Route Frequency Provider Last Rate Last Admin   0.9 %  sodium chloride infusion  500 mL Intravenous Continuous Charlaine Utsey V, DO        Allergies as of 01/07/2022   (No Known Allergies)    Family History  Problem Relation Age of Onset   Diabetes Mother    Multiple sclerosis Mother    Diabetes Father    Breast cancer Neg Hx    Colon cancer Neg Hx    Colon polyps Neg Hx    Crohn's disease Neg Hx    Esophageal cancer Neg Hx    Rectal cancer Neg Hx    Stomach cancer Neg Hx    Ulcerative colitis Neg Hx     Social History  Socioeconomic History   Marital status: Married    Spouse name: Not on file   Number of children: Not on file   Years of education: Not on file   Highest education level: Not on file  Occupational History   Not on file  Tobacco Use   Smoking status: Never    Passive exposure: Never   Smokeless tobacco: Never  Vaping Use   Vaping Use: Never used  Substance and Sexual Activity   Alcohol use: No   Drug use: No   Sexual activity: Not on file    Comment: endometrial ablation  Other Topics Concern   Not on file  Social History Narrative   Not on file   Social Determinants of Health   Financial Resource  Strain: Not on file  Food Insecurity: Not on file  Transportation Needs: Not on file  Physical Activity: Not on file  Stress: Not on file  Social Connections: Not on file  Intimate Partner Violence: Not on file    Physical Exam: Vital signs in last 24 hours: '@BP'$  (!) 155/89   Pulse 73   Temp (!) 96.8 F (36 C)   Ht 5' 7.25" (1.708 m)   Wt 229 lb (103.9 kg)   SpO2 100%   BMI 35.60 kg/m  GEN: NAD EYE: Sclerae anicteric ENT: MMM CV: Non-tachycardic Pulm: CTA b/l GI: Soft, NT/ND NEURO:  Alert & Oriented x [place], DO [facility]   01/07/2022 9:58 AM

## 2022-01-07 NOTE — Progress Notes (Signed)
Sedate, gd SR, tolerated procedure well, VSS, report to RN 

## 2022-01-07 NOTE — Patient Instructions (Signed)
Resume previous diet and continue present medications. Repeat colonoscopy in 10 years for surveillance purposes!  YOU HAD AN ENDOSCOPIC PROCEDURE TODAY AT [facility]:   Refer to the procedure report that was given to you for any specific questions about what was found during the examination.  If the procedure report does not answer your questions, please call your gastroenterologist to clarify.  If you requested that your care partner not be given the details of your procedure findings, then the procedure report has been included in a sealed envelope for you to review at your convenience later.  YOU SHOULD EXPECT: Some feelings of bloating in the abdomen. Passage of more gas than usual.  Walking can help get rid of the air that was put into your GI tract during the procedure and reduce the bloating. If you had a lower endoscopy (such as a colonoscopy or flexible sigmoidoscopy) you may notice spotting of blood in your stool or on the toilet paper. If you underwent a bowel prep for your procedure, you may not have a normal bowel movement for a few days.  Please Note:  You might notice some irritation and congestion in your nose or some drainage.  This is from the oxygen used during your procedure.  There is no need for concern and it should clear up in a day or so.  SYMPTOMS TO REPORT IMMEDIATELY:  Following lower endoscopy (colonoscopy or flexible sigmoidoscopy):  Excessive amounts of blood in the stool  Significant tenderness or worsening of abdominal pains  Swelling of the abdomen that is new, acute  Fever of 100F or higher  For urgent or emergent issues, a gastroenterologist can be reached at any hour by calling [phone]. Do not use MyChart messaging for urgent concerns.    DIET:  We do recommend a small meal at first, but then you may proceed to your regular diet.  Drink plenty of fluids but you should avoid alcoholic beverages for 24 hours.  ACTIVITY:  You should  plan to take it easy for the rest of today and you should NOT DRIVE or use heavy machinery until tomorrow (because of the sedation medicines used during the test).    FOLLOW UP: Our staff will call the number listed on your records the next business day following your procedure.  We will call around 7:15- 8:00 am to check on you and address any questions or concerns that you may have regarding the information given to you following your procedure. If we do not reach you, we will leave a message.     If any biopsies were taken you will be contacted by phone or by letter within the next 1-3 weeks.  Please call us at [phone] if you have not heard about the biopsies in 3 weeks.    SIGNATURES/CONFIDENTIALITY: You and/or your care partner have signed paperwork which will be entered into your electronic medical record.  These signatures attest to the fact that that the information above on your After Visit Summary has been reviewed and is understood.  Full responsibility of the confidentiality of this discharge information lies with you and/or your care-partner.

## 2022-01-08 ENCOUNTER — Telehealth: Payer: Self-pay

## 2022-01-08 NOTE — Telephone Encounter (Signed)
No answer, left message to call if having any issues or concerns, B.Arvis Zwahlen RN 

## 2022-02-03 NOTE — Progress Notes (Signed)
Established Patient Office Visit  Subjective   Patient ID: Bianca Bass, female    DOB: 04-17-1970  Age: 52 y.o. MRN: [id number]  Chief Complaint  Patient presents with   Follow-up    Weight management , follow up from hot flashes  she said that  better,   she has tried medication in the past she didn't like how it mad her feel and would like to discuss other medication    HPI  Bianca Bass is here to follow-up on weight management and hot flashes.   She states that her hot flashes are doing better.  She has been taking an over-the-counter Ashwagandha  supplement daily.  She has still been adjusting her diet and trying to lose weight.  She has been eating more vegetables and fruits.  She has been trying to limit her meat intake.  She has also been exercising regularly.  She was taking Wegovy 0.25 mg injection weekly and had lost 26 pounds on this, however it got put on national backorder.  She is still interested in taking a medication to help with weight loss.  She states that she did not like the way the the phentermine made her feel.    ROS See pertinent positives and negatives per HPI.    Objective:     BP 130/82   Pulse 77   Temp (!) 96.5 F (35.8 C)   Ht 5' 7.25" (1.708 m)   Wt 233 lb (105.7 kg)   SpO2 97%   BMI 36.22 kg/m  BP Readings from Last 3 Encounters:  02/04/22 130/82  01/07/22 99/60  11/26/21 134/88   Wt Readings from Last 3 Encounters:  02/04/22 233 lb (105.7 kg)  01/07/22 229 lb (103.9 kg)  12/10/21 229 lb (103.9 kg)   Physical Exam Vitals and nursing note reviewed.  Constitutional:      General: She is not in acute distress.    Appearance: Normal appearance.  HENT:     Head: Normocephalic.  Eyes:     Conjunctiva/sclera: Conjunctivae normal.  Cardiovascular:     Rate and Rhythm: Normal rate and regular rhythm.     Pulses: Normal pulses.     Heart sounds: Normal heart sounds.  Pulmonary:     Effort: Pulmonary effort is  normal.     Breath sounds: Normal breath sounds.  Musculoskeletal:     Cervical back: Normal range of motion.  Skin:    General: Skin is warm.  Neurological:     General: No focal deficit present.     Mental Status: She is alert and oriented to person, place, and time.  Psychiatric:        Mood and Affect: Mood normal.        Behavior: Behavior normal.        Thought Content: Thought content normal.        Judgment: Judgment normal.      Assessment & Plan:   Problem List Items Addressed This Visit       Cardiovascular and Mediastinum   Hot flashes - Primary    Symptoms have improved since started ashwagandha supplement daily.         Other   Obesity (BMI 30-39.9)    BMI 36.2. She has gained about 5 pounds since her last visit. She was taking Wegovy 0.25mg  injection weekly, however the medication was and is currently on national backorder. She had lost 26 pounds originally on Wegovy. She tried phentermine, however did not  like the way it made her feel. She has still been tracking her food intake and exercising regularly. She is still interested in medication to help with weight loss. Will have her start zepbound 2.5mg  injection weekly. Discussed possible side effects. Follow-up in 2 months.       Relevant Medications   tirzepatide (ZEPBOUND) 2.5 MG/0.5ML Pen    Return in about 2 months (around 04/05/2022) for weight management.    Gerre Scull, NP

## 2022-02-04 ENCOUNTER — Ambulatory Visit: Payer: Managed Care, Other (non HMO) | Admitting: Nurse Practitioner

## 2022-02-04 ENCOUNTER — Encounter: Payer: Self-pay | Admitting: Nurse Practitioner

## 2022-02-04 VITALS — BP 130/82 | HR 77 | Temp 96.5°F | Ht 67.25 in | Wt 233.0 lb

## 2022-02-04 DIAGNOSIS — E669 Obesity, unspecified: Secondary | ICD-10-CM | POA: Diagnosis not present

## 2022-02-04 DIAGNOSIS — R232 Flushing: Secondary | ICD-10-CM | POA: Diagnosis not present

## 2022-02-04 MED ORDER — ZEPBOUND 2.5 MG/0.5ML ~~LOC~~ SOAJ: 2.5000 mg | SUBCUTANEOUS | 1 refills | Status: DC

## 2022-02-04 NOTE — Patient Instructions (Signed)
It was great to see you!  Start zepbound injection once a week.   Continue doing well with your eating and exercise.   Let's follow-up in 2 months, sooner if you have concerns.  If a referral was placed today, you will be contacted for an appointment. Please note that routine referrals can sometimes take up to 3-4 weeks to process. Please call our office if you haven't heard anything after this time frame.  Take care,  Vance Peper, NP

## 2022-02-04 NOTE — Assessment & Plan Note (Signed)
Symptoms have improved since started ashwagandha supplement daily.

## 2022-02-04 NOTE — Assessment & Plan Note (Addendum)
BMI 36.2. She has gained about 5 pounds since her last visit. She was taking Wegovy 0.'25mg'$  injection weekly, however the medication was and is currently on national backorder. She had lost 26 pounds originally on Wegovy. She tried phentermine, however did not like the way it made her feel. She has still been tracking her food intake and exercising regularly. She is still interested in medication to help with weight loss. Will have her start zepbound 2.'5mg'$  injection weekly. Discussed possible side effects. Follow-up in 2 months.

## 2022-02-13 ENCOUNTER — Telehealth: Payer: Self-pay | Admitting: Nurse Practitioner

## 2022-02-13 NOTE — Telephone Encounter (Signed)
Pt is checking on status of this message, insurance has let her know it's a PA concern. Pt at [phone]

## 2022-02-14 ENCOUNTER — Other Ambulatory Visit (HOSPITAL_COMMUNITY): Payer: Self-pay

## 2022-02-15 ENCOUNTER — Other Ambulatory Visit (HOSPITAL_COMMUNITY): Payer: Self-pay

## 2022-02-15 NOTE — Telephone Encounter (Signed)
Pharmacy Patient Advocate Encounter   Received notification from [facility] that prior authorization for Zepbound 2.'5mg'$ /0.60m is required/requested.  Per Test Claim: Product/Service not covered. Non formulary drug   PA submitted on 02/15/22 to (ins) [employer] via CoverMyMeds Key [id number] Status is pending

## 2022-02-21 ENCOUNTER — Telehealth: Payer: Self-pay | Admitting: Nurse Practitioner

## 2022-02-21 NOTE — Telephone Encounter (Signed)
Pt called and stated she want to know if you are going to authorize the pharmacy for tirzepatide (ZEPBOUND) 2.5 MG/0.5ML Pen  because the pt been waiting a while

## 2022-02-21 NOTE — Telephone Encounter (Signed)
Hey just want to check the status of patient PA

## 2022-02-22 NOTE — Telephone Encounter (Signed)
Sent appeal though cover my meds

## 2022-02-22 NOTE — Telephone Encounter (Signed)
PA was denied. Please see telephone encounter on 02/13/22

## 2022-02-22 NOTE — Telephone Encounter (Signed)
Forwarded Message to [facility]

## 2022-02-22 NOTE — Telephone Encounter (Signed)
Called and explain to pt that her medication was denied and that we have sent for an appeal . Once we have heard back from her insurance we let her know if they have approved her medication.

## 2022-02-22 NOTE — Telephone Encounter (Signed)
Pt medication was denied

## 2022-02-22 NOTE — Telephone Encounter (Signed)
Pharmacy Patient Advocate Encounter  Received notification from [employer] that the request for prior authorization for Zepbound 2.'5MG'$ /0.5ML pen-injectors has been denied due to not meeting the prior authorization requirements.       Please be advised we currently do not have a Pharmacist to review denials, therefore you will need to process appeals accordingly as needed. Thanks for your support at this time.  E-Appeal is available, Key: [id number]

## 2022-03-01 NOTE — Telephone Encounter (Signed)
Dr Philippa Chester called to do a pier to pier and asked for a call back at [phone]

## 2022-03-06 MED ORDER — WEGOVY 0.25 MG/0.5ML ~~LOC~~ SOAJ: 0.2500 mg | SUBCUTANEOUS | 1 refills | Status: DC

## 2022-03-06 NOTE — Telephone Encounter (Signed)
Called and informed patient. 

## 2022-03-06 NOTE — Telephone Encounter (Signed)
Patient called to follow up because the medication is still out of stock, She would like a call back about what to do

## 2022-03-06 NOTE — Telephone Encounter (Signed)
Her Insurance company denied the the appeal

## 2022-03-06 NOTE — Telephone Encounter (Signed)
Called and spoke with patient informed that insurance has denied medication, she said that she would like to go back on her wegovy

## 2022-03-06 NOTE — Addendum Note (Signed)
Addended by: Vance Peper A on: 03/06/2022 04:10 PM   Modules accepted: Orders

## 2022-03-06 NOTE — Telephone Encounter (Signed)
I have called multiple pharmacy [facility] no one has 0.'25mg'$  in stock at this time they only 0.1

## 2022-03-07 NOTE — Telephone Encounter (Signed)
Called and spoke w/ pt  she said that she is going to wait and let us know on Monday  she decide to do. She would like to think about it, when over option with patient.

## 2022-03-12 NOTE — Telephone Encounter (Signed)
LVM to return call.

## 2022-03-18 NOTE — Telephone Encounter (Signed)
Tried to called Dr. Dr Sylvester Harder and was hung up on twice.

## 2022-03-18 NOTE — Telephone Encounter (Signed)
Noted, they denied the medication.

## 2022-04-02 ENCOUNTER — Telehealth: Payer: Self-pay | Admitting: Nurse Practitioner

## 2022-04-02 NOTE — Telephone Encounter (Signed)
error 

## 2022-04-08 ENCOUNTER — Telehealth: Payer: Self-pay | Admitting: Nurse Practitioner

## 2022-04-08 MED ORDER — SAXENDA 18 MG/3ML ~~LOC~~ SOPN: PEN_INJECTOR | SUBCUTANEOUS | 1 refills | Status: DC

## 2022-04-08 MED ORDER — INSULIN PEN NEEDLE 32G X 5 MM MISC: 1.0000 | Freq: Every day | 0 refills | Status: DC

## 2022-04-08 NOTE — Telephone Encounter (Signed)
Wants to change medicine from [facility] to [place].

## 2022-04-08 NOTE — Telephone Encounter (Signed)
Pt would like a call back to change her Wegovy to [place]. She stated that Lauren said this was okay.

## 2022-04-09 NOTE — Telephone Encounter (Signed)
Patient notified that Rx changed and sent into [facility]

## 2022-04-12 MED ORDER — WEGOVY 0.25 MG/0.5ML ~~LOC~~ SOAJ: 0.2500 mg | SUBCUTANEOUS | 1 refills | Status: DC

## 2022-04-12 NOTE — Addendum Note (Signed)
Addended by: Vance Peper A on: 04/12/2022 03:11 PM   Modules accepted: Orders

## 2022-04-12 NOTE — Telephone Encounter (Signed)
Pt has found out the pharmacy is out of Liraglutide -Weight Management ([place]) 18 MG/3ML SOPN [id number] and it's on back order. They are letting her know they have Wegovy.   [facility] [phone] - [place], [place] [address] [place] AT [place] [address] [place] [address] Phone: [phone]  Fax: [phone] DEA #: [id number]

## 2022-04-12 NOTE — Telephone Encounter (Signed)
LVM to return call.

## 2022-04-12 NOTE — Telephone Encounter (Signed)
Pt would like a cb concerning this issue. Pt at   [phone]

## 2022-04-12 NOTE — Telephone Encounter (Signed)
Patient notified that Rx sent in.

## 2022-04-15 ENCOUNTER — Other Ambulatory Visit (HOSPITAL_COMMUNITY): Payer: Self-pay

## 2022-04-15 ENCOUNTER — Telehealth: Payer: Self-pay

## 2022-04-15 NOTE — Telephone Encounter (Signed)
Pharmacy Patient Advocate Encounter   Received notification that prior authorization for [facility] 0.25mg /0.62ml is required/requested.  Per Test Claim: Prior authorization required   PA submitted on 04/15/22 to (ins) [employer] via CoverMyMeds Key or [facility]) confirmation # [id number] Status is pending

## 2022-04-15 NOTE — Telephone Encounter (Signed)
Received a fax regarding Prior Authorization from [employer] for [facility].  Authorization has been DENIED because Per your health plan's criteria, this drug is covered if you meet the following: (1) One of the following: (A) You have been receiving Wegovy therapy for up to 7 months and have had a weight loss of more than or equal to 5% of baseline body weight. (B) You have been receiving Wegovy therapy for more than 7 months and are continuing to experience or maintain weight loss. (2) Both of the following: (A) You have been following the treatment for four months in a row. (B) One of the following: (I) You are currently on a maintenance dose of 1.7mg  or 2.4mg  once weekly. (II) You have received less than 7 months of treatment with [facility] and are continuing to adjust to a target dose of 1.7mg  or 2.4mg  once weekly.Marland Kitchen  Phone# [phone]  Please be advised we currently do not have a pharmacist to review denials, therefore you will need to process appeals accordingly as needed. Thanks for your support at this time.

## 2022-04-15 NOTE — Telephone Encounter (Signed)
Pt is calling in stating her pharmacy let her know, she is not able to get this until the PA is done. Please advise pt at   [phone]

## 2022-04-16 NOTE — Telephone Encounter (Signed)
I called to get the denial form faxed here and to be filled out. The representative gave another phone number to call to do an appeal over the phone and the number is [phone]. Also gave a fax number to fax additional notes to and it is [phone]

## 2022-04-16 NOTE — Telephone Encounter (Signed)
   E-appeal is also available. Key: [id number]

## 2022-04-17 NOTE — Telephone Encounter (Signed)
Notes faxed for an appeal of the [facility].

## 2022-04-19 ENCOUNTER — Other Ambulatory Visit (HOSPITAL_COMMUNITY): Payer: Self-pay

## 2022-04-19 NOTE — Telephone Encounter (Signed)
Patient notified that prior auth approved for [facility].

## 2022-04-19 NOTE — Telephone Encounter (Signed)
Pharmacy Patient Advocate Encounter  Prior Authorization for Wegovy inj 0.25mg  has been approved by [employer] (ins).    PA # [id number] Effective dates: 04/15/2022 through 11/17/2022 LETTER OF APPROVAL HAS BEEN SCANNED IN TO MEDIA IN CHART  Copay is $24.99  [place] Rx Patient Advocate [phone]

## 2022-06-17 ENCOUNTER — Other Ambulatory Visit: Payer: Self-pay | Admitting: Nurse Practitioner

## 2022-07-05 ENCOUNTER — Telehealth: Payer: Self-pay

## 2022-07-05 ENCOUNTER — Telehealth: Payer: Self-pay | Admitting: Nurse Practitioner

## 2022-07-05 MED ORDER — WEGOVY 0.5 MG/0.5ML ~~LOC~~ SOAJ: 0.5000 mg | SUBCUTANEOUS | 1 refills | Status: DC

## 2022-07-05 NOTE — Telephone Encounter (Signed)
Patient notified that Rx was changed and sent to pharmacy and that I will request prior auth for her medication.

## 2022-07-05 NOTE — Telephone Encounter (Signed)
Prior Berkley Harvey is needed on Wegovy 0.5mg 

## 2022-07-05 NOTE — Telephone Encounter (Signed)
Pt called and said that she need prior authorization for WEGOVY 0.25 MG/0.5ML SOAJ [[id number]]  and the pat said the dosage need to be up more because the dosage you have her on is not working

## 2022-07-08 ENCOUNTER — Other Ambulatory Visit (HOSPITAL_COMMUNITY): Payer: Self-pay

## 2022-07-08 ENCOUNTER — Telehealth: Payer: Self-pay | Admitting: Nurse Practitioner

## 2022-07-08 ENCOUNTER — Telehealth: Payer: Self-pay

## 2022-07-08 NOTE — Telephone Encounter (Signed)
I called patient and told her that her Rx of Bianca Bass was [facility] into [facility] on [address]. Patient said she will pick it up there and thought she had requested for it to go to [facility].

## 2022-07-08 NOTE — Telephone Encounter (Signed)
*  Primary  PA request received via CMM for Wegovy 0.5MG /0.5ML auto-injectors  PA submitted to [employer] and is pending determination  *patient did not like how phentermine made them feel  Key: [id number]

## 2022-07-08 NOTE — Telephone Encounter (Signed)
Pt called because she stated you was suppose to send wegovy prescription to her pharmacy and it is not there. Please give the pt a call

## 2022-07-08 NOTE — Telephone Encounter (Signed)
Edwena Bunde  [phone]  They need the Code to give PA PA # [id number]

## 2022-07-08 NOTE — Telephone Encounter (Signed)
This medication or product was previously approved on [id number] from 04/15/2022 to 11/17/2022.

## 2022-07-09 NOTE — Telephone Encounter (Signed)
error 

## 2022-07-10 NOTE — Telephone Encounter (Signed)
I called [employer] and Reginal Lutes is approved from 03/2022 to 10/2022

## 2022-07-15 NOTE — Telephone Encounter (Signed)
Pt called stating she needed PA for [facility]. Pt was advised PA was approved. I called [facility] for her and they got a rejection that pt must enroll in Weight Watchers at [facility] for obesity, then wait 24 hours after enrollment to process refill.

## 2022-09-23 LAB — HEMOGLOBIN A1C: Hemoglobin A1C: 5.8

## 2022-09-23 LAB — LIPID PANEL
Cholesterol: 196 (ref 0–200)
HDL: 47 (ref 35–70)
Triglycerides: 197 — AB (ref 40–160)

## 2022-09-23 LAB — BASIC METABOLIC PANEL: Glucose: 84

## 2022-09-25 ENCOUNTER — Telehealth: Payer: Self-pay | Admitting: Nurse Practitioner

## 2022-09-25 MED ORDER — WEGOVY 1 MG/0.5ML ~~LOC~~ SOAJ: 1.0000 mg | SUBCUTANEOUS | 0 refills | Status: DC

## 2022-09-25 NOTE — Telephone Encounter (Signed)
Pt would like her Wegovy to raise from 0.5 to 1. She needs this filled.

## 2022-09-26 NOTE — Telephone Encounter (Signed)
I called and spoke with patient and notified her of below message and she said that she will call back later to make an appointment.

## 2022-11-01 ENCOUNTER — Other Ambulatory Visit: Payer: Self-pay | Admitting: Obstetrics and Gynecology

## 2022-11-01 DIAGNOSIS — Z1231 Encounter for screening mammogram for malignant neoplasm of breast: Secondary | ICD-10-CM

## 2022-11-28 ENCOUNTER — Ambulatory Visit
Admission: RE | Admit: 2022-11-28 | Discharge: 2022-11-28 | Disposition: A | Discharge status: Home / Self Care | Payer: Managed Care, Other (non HMO) | Source: Ambulatory Visit | Attending: Obstetrics and Gynecology | Admitting: Obstetrics and Gynecology

## 2022-11-28 DIAGNOSIS — Z1231 Encounter for screening mammogram for malignant neoplasm of breast: Secondary | ICD-10-CM

## 2022-12-16 ENCOUNTER — Telehealth: Payer: Self-pay | Admitting: Nurse Practitioner

## 2022-12-16 ENCOUNTER — Telehealth: Payer: Self-pay

## 2022-12-16 MED ORDER — WEGOVY 1 MG/0.5ML ~~LOC~~ SOAJ: 1.0000 mg | SUBCUTANEOUS | 0 refills | Status: DC

## 2022-12-16 NOTE — Telephone Encounter (Signed)
Patient notified that Rx refilled and sent to pharmacy 

## 2022-12-16 NOTE — Telephone Encounter (Signed)
Can we get a prior auth on Wegovy 1mg /0.56ml?  Plan does not cover medication and please call [phone] to initiate a prior auth. Patient ID #[id number].

## 2022-12-16 NOTE — Telephone Encounter (Signed)
Prescription Request  12/16/2022  LOV: 02/04/2022  What is the name of the medication or equipment? Semaglutide-Weight Management (WEGOVY) 1 MG/0.5ML SOAJ   Have you contacted your pharmacy to request a refill? Yes   Which pharmacy would you like this sent to?  [facility] [phone] - [place], [place] - [address] AT [facility] [address] [place] [address] Phone: [phone] Fax: [phone]     Patient notified that their request is being sent to the clinical staff for review and that they should receive a response within 2 business days.   Please advise at Mobile [phone] (mobile)

## 2022-12-17 ENCOUNTER — Telehealth: Payer: Self-pay

## 2022-12-17 ENCOUNTER — Other Ambulatory Visit (HOSPITAL_COMMUNITY): Payer: Self-pay

## 2022-12-17 NOTE — Telephone Encounter (Signed)
PA approved from 12/17/22 - 06/16/2023.  Pharmacy notified VIA  phone. Dm/cma

## 2022-12-17 NOTE — Telephone Encounter (Signed)
I called and spoke with patient and notified her that Rx of Bianca Bass was approved per previous encounter and that we needed to get an updated weight on her schedule a nurse visit and patient is scheduled for an office visit already for 12/2/204. Is this ok.

## 2022-12-17 NOTE — Telephone Encounter (Signed)
Good morning. Can you please help with one of the questions on the PA for the [facility]?

## 2022-12-17 NOTE — Telephone Encounter (Signed)
PA submitted through cover my meds.  Awaiting response.  Dm/cma   Key: [id number]

## 2022-12-19 NOTE — Telephone Encounter (Signed)
I called and spoke with patient and notified her of below message she had no issues picking up her medication and it was covered by her insurance.  There were two people working on the prior auth.

## 2022-12-19 NOTE — Telephone Encounter (Signed)
Approved I- 12-17-22 to 06/16/23  Request reference # [id number]

## 2022-12-19 NOTE — Telephone Encounter (Signed)
Noted  

## 2022-12-30 ENCOUNTER — Encounter: Payer: Self-pay | Admitting: Nurse Practitioner

## 2022-12-30 ENCOUNTER — Ambulatory Visit: Payer: Managed Care, Other (non HMO) | Admitting: Nurse Practitioner

## 2022-12-30 VITALS — BP 124/72 | HR 65 | Temp 97.5°F | Ht 67.25 in | Wt 212.8 lb

## 2022-12-30 DIAGNOSIS — R7303 Prediabetes: Secondary | ICD-10-CM | POA: Diagnosis not present

## 2022-12-30 DIAGNOSIS — E669 Obesity, unspecified: Secondary | ICD-10-CM

## 2022-12-30 DIAGNOSIS — E7800 Pure hypercholesterolemia, unspecified: Secondary | ICD-10-CM | POA: Diagnosis not present

## 2022-12-30 DIAGNOSIS — F418 Other specified anxiety disorders: Secondary | ICD-10-CM

## 2022-12-30 DIAGNOSIS — J4520 Mild intermittent asthma, uncomplicated: Secondary | ICD-10-CM | POA: Diagnosis not present

## 2022-12-30 MED ORDER — ALPRAZOLAM 0.25 MG PO TABS: ORAL_TABLET | ORAL | 0 refills | Status: DC

## 2022-12-30 MED ORDER — FLUTICASONE PROPIONATE HFA 110 MCG/ACT IN AERO: 2.0000 | INHALATION_SPRAY | Freq: Two times a day (BID) | RESPIRATORY_TRACT | 1 refills | Status: DC

## 2022-12-30 MED ORDER — ZEPBOUND 2.5 MG/0.5ML ~~LOC~~ SOAJ: 2.5000 mg | SUBCUTANEOUS | 0 refills | Status: DC

## 2022-12-30 NOTE — Patient Instructions (Addendum)
It was great to see you!  Let's start zepbound injection once a week. We will see if your insurance covers this.   [facility]:  Address: [address] [place], [place], [place] [address] Phone: [phone]  I have sent in 2 xanax pills to take 1 hour before your flight out and flight home  Let's follow-up in 3 months, sooner if you have concerns.  If a referral was placed today, you will be contacted for an appointment. Please note that routine referrals can sometimes take up to 3-4 weeks to process. Please call our office if you haven't heard anything after this time frame.  Take care,  Rodman Pickle, NP

## 2022-12-30 NOTE — Assessment & Plan Note (Signed)
Her inhaler has expired and is used as needed. We will refill flovent inhaler to use twice daily as needed.

## 2022-12-30 NOTE — Assessment & Plan Note (Signed)
Her cholesterol is slightly elevated. We will continue dietary modifications and weight loss efforts.

## 2022-12-30 NOTE — Progress Notes (Signed)
Established Patient Office Visit  Subjective   Patient ID: Laciana Picker, female    DOB: Feb 06, 1970  Age: 52 y.o. MRN: [id number]  Chief Complaint  Patient presents with   Medication Management    Follow up    HPI  Discussed the use of AI scribe software for clinical note transcription with the patient, who gave verbal consent to proceed.  History of Present Illness   The patient, with a history of obesity, presents for a routine follow-up. She reports a weight loss of 21 pounds since the last visit, which she attributes to a combination of intermittent fasting, a plant-based diet, and the effects of Wegovy, a weight loss medication. However, the patient has been off Wegovy for a few months due to stock issues at her pharmacy. She expresses a desire to restart the medication but is concerned about potential side effects if she starts at the current dose she has at home.  In addition to her weight concerns, the patient is experiencing anxiety related to an upcoming flight. She describes a recent incident of claustrophobia during an MRI scan, which has since triggered anxiety in enclosed spaces, including a car wash and the anticipated airplane trip. The patient requests medication to help manage this anxiety during the flight.  The patient also reports a recent recovery from a cold, with symptoms including a sore throat. She tested negative for COVID-19 during this illness.        ROS See pertinent positives and negatives per HPI.    Objective:     BP 124/72 (BP Location: Right Arm)   Pulse 65   Temp (!) 97.5 F (36.4 C)   Ht 5' 7.25" (1.708 m)   Wt 212 lb 12.8 oz (96.5 kg)   SpO2 96%   BMI 33.08 kg/m  BP Readings from Last 3 Encounters:  12/30/22 124/72  02/04/22 130/82  01/07/22 99/60   Wt Readings from Last 3 Encounters:  12/30/22 212 lb 12.8 oz (96.5 kg)  02/04/22 233 lb (105.7 kg)  01/07/22 229 lb (103.9 kg)      Physical Exam Vitals and nursing note  reviewed.  Constitutional:      General: She is not in acute distress.    Appearance: Normal appearance.  HENT:     Head: Normocephalic.  Eyes:     Conjunctiva/sclera: Conjunctivae normal.  Cardiovascular:     Rate and Rhythm: Normal rate and regular rhythm.     Pulses: Normal pulses.     Heart sounds: Normal heart sounds.  Pulmonary:     Effort: Pulmonary effort is normal.     Breath sounds: Normal breath sounds.  Musculoskeletal:     Cervical back: Normal range of motion.  Skin:    General: Skin is warm.  Neurological:     General: No focal deficit present.     Mental Status: She is alert and oriented to person, place, and time.  Psychiatric:        Mood and Affect: Mood normal.        Behavior: Behavior normal.        Thought Content: Thought content normal.        Judgment: Judgment normal.      Assessment & Plan:   Problem List Items Addressed This Visit       Respiratory   Mild intermittent asthma without complication    Her inhaler has expired and is used as needed. We will refill flovent inhaler to use twice daily  as needed.       Relevant Medications   fluticasone (FLOVENT HFA) 110 MCG/ACT inhaler     Other   Obesity (BMI 30-39.9)    She has achieved significant weight loss but is experiencing difficulty maintaining Wegovy due to stock issues. We discussed Zepbound as an alternative medication, which is in the same class and has a similar side effect profile. We will trial Zepbound 2.5mg  injection weekly if insurance covers it; if not, we will resume Wegovy at 0.25mg  and titrate up as tolerated. Follow-up in 3 months.       Relevant Medications   tirzepatide (ZEPBOUND) 2.5 MG/0.5ML Pen   Pure hypercholesterolemia - Primary    Her cholesterol is slightly elevated. We will continue dietary modifications and weight loss efforts.      Prediabetes    Her A1c is 5.8%, in the prediabetic range. We will continue dietary modifications and weight loss efforts and  monitor A1c.      Other Visit Diagnoses     Situational anxiety       She has a history of claustrophobia and anxiety with flying. We will prescribe two Xanax 0.25mg  tablets to be taken one hour before each flight. PDMP reviewed.   Relevant Medications   ALPRAZolam (XANAX) 0.25 MG tablet      Return in about 3 months (around 03/30/2023) for weight management.    Gerre Scull, NP

## 2022-12-30 NOTE — Assessment & Plan Note (Signed)
Her A1c is 5.8%, in the prediabetic range. We will continue dietary modifications and weight loss efforts and monitor A1c.

## 2022-12-30 NOTE — Assessment & Plan Note (Signed)
She has achieved significant weight loss but is experiencing difficulty maintaining Wegovy due to stock issues. We discussed Zepbound as an alternative medication, which is in the same class and has a similar side effect profile. We will trial Zepbound 2.5mg  injection weekly if insurance covers it; if not, we will resume Wegovy at 0.25mg  and titrate up as tolerated. Follow-up in 3 months.

## 2022-12-31 ENCOUNTER — Telehealth: Payer: Self-pay

## 2022-12-31 NOTE — Telephone Encounter (Signed)
Received fax from [facility] that Fluticasone HFA is not covered. Preferred alternative is Arnuityeptinh mcg, Ovar Red Inhaler

## 2022-12-31 NOTE — Telephone Encounter (Signed)
Received a fax from [facility] that Zepbound 2.5mg  not covered by plan and the preferred alternatives are Phentermine Cap, Diethylprop Tab

## 2023-01-01 ENCOUNTER — Other Ambulatory Visit (HOSPITAL_COMMUNITY): Payer: Self-pay

## 2023-01-01 ENCOUNTER — Other Ambulatory Visit: Payer: Self-pay

## 2023-01-01 MED ORDER — WEGOVY 0.25 MG/0.5ML ~~LOC~~ SOAJ
0.2500 mg | SUBCUTANEOUS | 0 refills | Status: DC
Start: 2023-01-01 — End: 2023-02-25
  Filled 2023-01-01 – 2023-01-14 (×2): qty 2, 28d supply, fill #0

## 2023-01-01 MED ORDER — ALBUTEROL SULFATE HFA 108 (90 BASE) MCG/ACT IN AERS: 2.0000 | INHALATION_SPRAY | Freq: Four times a day (QID) | RESPIRATORY_TRACT | 1 refills | Status: DC | PRN

## 2023-01-01 NOTE — Telephone Encounter (Signed)
I called and spoke with patient and notified of below message and she said thank you.

## 2023-01-13 ENCOUNTER — Other Ambulatory Visit (HOSPITAL_COMMUNITY): Payer: Self-pay

## 2023-01-13 NOTE — Telephone Encounter (Signed)
Pt is wanting Bianca Bass to know Bianca Bass is on back order and she be put on Zepbound. She would like to see if her insurance will cover it since she can not get it.

## 2023-01-14 ENCOUNTER — Other Ambulatory Visit (HOSPITAL_COMMUNITY): Payer: Self-pay

## 2023-01-14 MED ORDER — SEMAGLUTIDE-WEIGHT MANAGEMENT 1 MG/0.5ML ~~LOC~~ SOAJ
1.0000 mg | SUBCUTANEOUS | 0 refills | Status: DC
  Filled 2023-01-14: qty 2, 28d supply, fill #0

## 2023-01-14 NOTE — Telephone Encounter (Signed)
I called patient in regards to prescription and she would like her Rx of Wegovy to be sent to [facility]. I looked at Rx and Rx sent to [facility] on 01/01/23. Patient will call pharmacy and if any further questions or concerns will call the office back.

## 2023-01-14 NOTE — Telephone Encounter (Signed)
LVM to return call.

## 2023-01-14 NOTE — Telephone Encounter (Signed)
Patient returned call and said that her pharmacy said that [facility] is on back order. I asked patient has she tried other pharmacies and she has not. I gave her numbers to [facility] and she will call me back once she calls them and other pharmacies.

## 2023-02-25 ENCOUNTER — Other Ambulatory Visit (HOSPITAL_COMMUNITY): Payer: Self-pay

## 2023-02-25 ENCOUNTER — Other Ambulatory Visit: Payer: Self-pay | Admitting: Nurse Practitioner

## 2023-02-25 MED ORDER — WEGOVY 0.25 MG/0.5ML ~~LOC~~ SOAJ
0.2500 mg | SUBCUTANEOUS | 0 refills | Status: DC
  Filled 2023-02-25: qty 2, 28d supply, fill #0

## 2023-03-24 ENCOUNTER — Other Ambulatory Visit: Payer: Self-pay | Admitting: Nurse Practitioner

## 2023-03-24 ENCOUNTER — Other Ambulatory Visit (HOSPITAL_COMMUNITY): Payer: Self-pay

## 2023-03-24 ENCOUNTER — Encounter (HOSPITAL_COMMUNITY): Payer: Self-pay

## 2023-03-24 ENCOUNTER — Telehealth: Payer: Self-pay

## 2023-03-24 MED ORDER — WEGOVY 0.5 MG/0.5ML ~~LOC~~ SOAJ: 0.5000 mg | SUBCUTANEOUS | 0 refills | Status: DC

## 2023-03-24 NOTE — Telephone Encounter (Signed)
 I called and spoke with patient and notified her that Lauren sent in a refill of Wegovy and increased the dosage.

## 2023-03-24 NOTE — Telephone Encounter (Signed)
Dosage increased.

## 2023-03-24 NOTE — Telephone Encounter (Signed)
 Requesting: Wegovy 0.25mg /0.31ml requesting an increase Last Visit: 12/30/2022 Next Visit: 04/07/2023 Last Refill: 02/25/2023  Please Advise

## 2023-03-24 NOTE — Telephone Encounter (Signed)
 Copied from CRM [id number]. Topic: Clinical - Medication Question >> Mar 24, 2023 10:20 AM Presley Raddle C wrote: Reason for CRM: pt called and requested to increase her Wegovy dose from 0.25 to 0.5. She explained that she hasn't noticed a change in her appetite. Please call and advise.

## 2023-03-25 ENCOUNTER — Other Ambulatory Visit (HOSPITAL_COMMUNITY): Payer: Self-pay

## 2023-04-07 ENCOUNTER — Ambulatory Visit: Payer: Managed Care, Other (non HMO) | Admitting: Nurse Practitioner

## 2023-04-23 ENCOUNTER — Other Ambulatory Visit: Payer: Self-pay | Admitting: Nurse Practitioner

## 2023-04-23 MED ORDER — ALPRAZOLAM 0.25 MG PO TABS: ORAL_TABLET | ORAL | 0 refills | Status: DC

## 2023-04-23 NOTE — Telephone Encounter (Signed)
 Patient needs the RX sent to [facility]. Marland Kitchen  Dm/cma

## 2023-04-23 NOTE — Telephone Encounter (Signed)
 Refill request for  Alprazolam 0.25 mg LR 12/30/22, #2, 0 rf LOV  12/30/22 FOV none scheduled.   Please review and advise.  Thanks.  Dm/cma

## 2023-04-23 NOTE — Telephone Encounter (Signed)
 Copied from CRM [id number]. Topic: Clinical - Medication Refill >> Apr 23, 2023 11:00 AM Pascal Lux wrote: Most Recent Primary Care Visit:  Provider: Rodman Pickle A  Department: [facility]  Visit Type: OFFICE VISIT  Date: 12/30/2022  Medication: ALPRAZolam (XANAX) 0.25 MG tablet [[id number]]  Has the patient contacted their pharmacy? No (Agent: If no, request that the patient contact the pharmacy for the refill. If patient does not wish to contact the pharmacy document the reason why and proceed with request.) (Agent: If yes, when and what did the pharmacy advise?)  Is this the correct pharmacy for this prescription? Yes If no, delete pharmacy and type the correct one.  This is the patient's preferred pharmacy:   [facility] [address], [place] - [address] [place] [address] Phone: [phone] Fax: [phone]  [id number] [place], [place] - [address] [place] [address] Phone: [phone] Fax: [phone]   Has the prescription been filled recently? No  Is the patient out of the medication? No  Has the patient been seen for an appointment in the last year OR does the patient have an upcoming appointment? Yes  Can we respond through MyChart? Yes  Agent: Please be advised that Rx refills may take up to 3 business days. We ask that you follow-up with your pharmacy.

## 2023-04-23 NOTE — Addendum Note (Signed)
 Addended by: Waymond Cera on: 04/23/2023 02:43 PM   Modules accepted: Orders

## 2023-04-23 NOTE — Telephone Encounter (Signed)
 Patient has a flight in the morning and she does have tablets.  She is concerned that she might need more just in case? Please review and advise.

## 2023-05-06 ENCOUNTER — Other Ambulatory Visit: Payer: Self-pay

## 2023-05-21 ENCOUNTER — Telehealth: Payer: Self-pay

## 2023-05-21 NOTE — Telephone Encounter (Signed)
 I called and spoke with patient and scheduled her an appointment for 05/28/2023.

## 2023-05-21 NOTE — Telephone Encounter (Signed)
 Requesting: Semaglutide -Weight Management (WEGOVY ) 0.5 MG/0.5ML SOAJ.  Last Visit: 12/30/2022 Next Visit: Visit date not found Last Refill: 03/24/2023  Please Advise

## 2023-05-21 NOTE — Telephone Encounter (Signed)
 Copied from CRM [id number]. Topic: Clinical - Medication Question >> May 21, 2023  4:31 PM Alyse July wrote: Reason for CRM: Patient would like to know if she can have a refill on her Semaglutide -Weight Management (WEGOVY ) 0.5 MG/0.5ML SOAJ. Provider schedule doesn't align with patient availability so she is unable to come into the office on the days provider is available. Please contact patient to advise.

## 2023-05-28 ENCOUNTER — Other Ambulatory Visit (HOSPITAL_COMMUNITY): Payer: Self-pay

## 2023-05-28 ENCOUNTER — Encounter: Payer: Self-pay | Admitting: Nurse Practitioner

## 2023-05-28 ENCOUNTER — Ambulatory Visit: Admitting: Nurse Practitioner

## 2023-05-28 VITALS — BP 106/68 | HR 67 | Temp 97.2°F | Ht 68.5 in | Wt 197.2 lb

## 2023-05-28 DIAGNOSIS — E663 Overweight: Secondary | ICD-10-CM | POA: Insufficient documentation

## 2023-05-28 DIAGNOSIS — L729 Follicular cyst of the skin and subcutaneous tissue, unspecified: Secondary | ICD-10-CM | POA: Insufficient documentation

## 2023-05-28 MED ORDER — WEGOVY 1 MG/0.5ML ~~LOC~~ SOAJ
1.0000 mg | SUBCUTANEOUS | 0 refills | Status: DC
  Filled 2023-05-28: qty 2, 28d supply, fill #0

## 2023-05-28 NOTE — Progress Notes (Signed)
 Established Patient Office Visit  Subjective   Patient ID: Bianca Bass, female    DOB: 01/02/1971  Age: 53 y.o. MRN: [id number]  Chief Complaint  Patient presents with   Weight Management    Follow up, Rx refills, no concerns    HPI  Discussed the use of AI scribe software for clinical note transcription with the patient, who gave verbal consent to proceed.  History of Present Illness   Bianca Stansfield "Burdette Carolin" is a 53 year old female who presents for follow-up regarding weight management and medication adjustment.  She has lost 46 pounds on Wegovy  and is currently out of the medication, with plans to increase her dose to 1 mg weekly. Her current weight is 197 pounds, with a goal of 180 pounds. She tracks calories, exercises, and attempts to avoid snacking, though she finds it challenging. She recently resumed exercising two weeks ago and is committed to maintaining this routine.  She attempted a plant-based diet but experienced increased hunger and dislikes beans and tofu. She has incorporated more meat and fruit into her diet.  She has a recurring cyst on her shoulder/neck, previously removed twice, causing pain and increasing in size. She uses warm compresses for relief and advil which has helped.        ROS See pertinent positives and negatives per HPI.    Objective:     BP 106/68 (BP Location: Right Arm, Patient Position: Sitting, Cuff Size: Normal)   Pulse 67   Temp (!) 97.2 F (36.2 C)   Ht 5' 8.5" (1.74 m)   Wt 197 lb 3.2 oz (89.4 kg)   LMP  (Approximate)   SpO2 99%   BMI 29.55 kg/m  BP Readings from Last 3 Encounters:  05/28/23 106/68  12/30/22 124/72  02/04/22 130/82   Wt Readings from Last 3 Encounters:  05/28/23 197 lb 3.2 oz (89.4 kg)  12/30/22 212 lb 12.8 oz (96.5 kg)  02/04/22 233 lb (105.7 kg)      Physical Exam Vitals and nursing note reviewed.  Constitutional:      General: She is not in acute distress.    Appearance: Normal  appearance.  HENT:     Head: Normocephalic.  Eyes:     Conjunctiva/sclera: Conjunctivae normal.  Cardiovascular:     Rate and Rhythm: Normal rate and regular rhythm.     Pulses: Normal pulses.     Heart sounds: Normal heart sounds.  Pulmonary:     Effort: Pulmonary effort is normal.     Breath sounds: Normal breath sounds.  Musculoskeletal:     Cervical back: Normal range of motion.  Skin:    General: Skin is warm.     Comments: 2cm diameter cyst under skin on right upper back  Neurological:     General: No focal deficit present.     Mental Status: She is alert and oriented to person, place, and time.  Psychiatric:        Mood and Affect: Mood normal.        Behavior: Behavior normal.        Thought Content: Thought content normal.        Judgment: Judgment normal.    The 10-year ASCVD risk score (Arnett DK, et al., 2019) is: 2%* (Cholesterol units were assumed)    Assessment & Plan:   Problem List Items Addressed This Visit       Musculoskeletal and Integument   Cyst of skin - Primary   She  experiences pain from a recurrent upper right back cyst, previously incompletely removed, which has increased in size. A surgical consultation is preferred. Refer her to [facility] for cyst removal and advise warm compresses and advil until the consultation.      Relevant Orders   Ambulatory referral to General Surgery     Other   Overweight   Her weight is managed with Wegovy , resulting in a 46-pound weight loss. She is ready to increase the dose to 1 mg weekly. Discussed post-goal weight medication maintenance, exercise, and dietary changes. Increase Wegovy  to 1 mg weekly and send the prescription to [facility]. Follow up in three months to assess progress and conduct a physical exam.        Return in about 3 months (around 08/27/2023) for CPE.    Odette Benjamin, NP

## 2023-05-28 NOTE — Assessment & Plan Note (Signed)
 She experiences pain from a recurrent upper right back cyst, previously incompletely removed, which has increased in size. A surgical consultation is preferred. Refer her to [facility] for cyst removal and advise warm compresses and advil until the consultation.

## 2023-05-28 NOTE — Assessment & Plan Note (Signed)
 Her weight is managed with Wegovy , resulting in a 46-pound weight loss. She is ready to increase the dose to 1 mg weekly. Discussed post-goal weight medication maintenance, exercise, and dietary changes. Increase Wegovy  to 1 mg weekly and send the prescription to [facility]. Follow up in three months to assess progress and conduct a physical exam.

## 2023-05-28 NOTE — Patient Instructions (Addendum)
 It was great to see you!  Keep up the great work!   Let's increase your wegovy  to 1mg  weekly   I have placed a referral to surgery for the cyst on your neck   Let's follow-up in 3 months, sooner if you have concerns.  If a referral was placed today, you will be contacted for an appointment. Please note that routine referrals can sometimes take up to 3-4 weeks to process. Please call our office if you haven't heard anything after this time frame.  Take care,  Rheba Cedar, NP

## 2023-05-30 ENCOUNTER — Ambulatory Visit: Payer: Self-pay

## 2023-05-30 NOTE — Telephone Encounter (Signed)
 Copied from CRM [id number]. Topic: Clinical - Red Word Triage >> May 30, 2023 11:06 AM Howard Macho wrote: Reason for CRM: patient called stating she was referred to a dermatologist and they never called her and she is having pain on her back and she does not want to go to the emergency room   Answer Assessment - Initial Assessment Questions 1. REASON FOR CALL or QUESTION: "What is your reason for calling today?" or "How can I best help you?" or "What question do you have that I can help answer?"  Pt called stated has cyst on right shoulder: pt stated PCP referred her to dermatologist for possible surgery to remove.  Pt stated she has not received a call from dermatologist and would like to have someone provide pt with dermatologist phone number so she can follow up with dermatologist.  Protocols used: Information Only Call - No Triage-A-AH

## 2023-06-02 NOTE — Telephone Encounter (Signed)
 I called and spoke with patient and gave her the contact number to call and schedule an appointment.  Referring To Provider Information [facility] [address] [place] [address] [phone]

## 2023-06-11 ENCOUNTER — Other Ambulatory Visit: Payer: Self-pay | Admitting: Surgery

## 2023-06-13 LAB — DERMATOLOGY PATHOLOGY

## 2023-07-15 ENCOUNTER — Other Ambulatory Visit: Payer: Self-pay | Admitting: Nurse Practitioner

## 2023-07-15 NOTE — Telephone Encounter (Signed)
 Copied from CRM [id number]. Topic: Clinical - Medication Refill >> Jul 15, 2023  1:23 PM [place] M wrote: Medication: Semaglutide -Weight Management (WEGOVY ) 1 MG/0.5ML SOAJ  Has the patient contacted their pharmacy? Yes (Agent: If no, request that the patient contact the pharmacy for the refill. If patient does not wish to contact the pharmacy document the reason why and proceed with request.) (Agent: If yes, when and what did the pharmacy advise?)   [facility] [address] [place] [address] Phone: [phone] Fax: [phone]  Is this the correct pharmacy for this prescription? Yes If no, delete pharmacy and type the correct one.   Has the prescription been filled recently? Yes  Is the patient out of the medication? Yes  Has the patient been seen for an appointment in the last year OR does the patient have an upcoming appointment? Yes  Can we respond through MyChart? Yes  Agent: Please be advised that Rx refills may take up to 3 business days. We ask that you follow-up with your pharmacy.

## 2023-07-15 NOTE — Telephone Encounter (Signed)
 Copied from CRM [id number]. Topic: Clinical - Medication Question >> Jul 15, 2023  1:22 PM [place] M wrote: Reason for CRM: Patient is currently taking Semaglutide -Weight Management (WEGOVY ) 1 MG/0.5ML SOAJ and wanting to know if she can go up in dosage

## 2023-07-16 ENCOUNTER — Other Ambulatory Visit (HOSPITAL_COMMUNITY): Payer: Self-pay

## 2023-07-16 ENCOUNTER — Other Ambulatory Visit: Payer: Self-pay | Admitting: Nurse Practitioner

## 2023-07-16 ENCOUNTER — Telehealth: Payer: Self-pay

## 2023-07-16 MED ORDER — WEGOVY 1.7 MG/0.75ML ~~LOC~~ SOAJ
1.7000 mg | SUBCUTANEOUS | 1 refills | Status: DC
  Filled 2023-07-16 – 2023-07-21 (×2): qty 3, 28d supply, fill #0

## 2023-07-16 NOTE — Telephone Encounter (Signed)
 Pharmacy Patient Advocate Encounter   Received notification from CoverMyMeds that prior authorization for Wegovy  is required/requested.   Unable to verify patient insurance. Card in patient media has expired. Eligibility checks show no current plan    Please advise

## 2023-07-17 NOTE — Telephone Encounter (Signed)
 I called and spoke with patient and verified insurance card that is scanned into under media and patient said this is her only insurance card and it is up to date.    Can we try to get prior auth again. Received form from [facility]  Wegovy  1.7mg /0.78ml?

## 2023-07-18 ENCOUNTER — Other Ambulatory Visit (HOSPITAL_COMMUNITY): Payer: Self-pay

## 2023-07-21 ENCOUNTER — Other Ambulatory Visit (HOSPITAL_COMMUNITY): Payer: Self-pay

## 2023-07-21 ENCOUNTER — Telehealth: Payer: Self-pay

## 2023-07-21 NOTE — Telephone Encounter (Signed)
 Pharmacy Patient Advocate Encounter   Received notification from Physician's Office that prior authorization for Wegovy  1.7 is required/requested.   Insurance verification completed.   The patient is insured through [employer] .   Per test claim: PA required; PA submitted to above mentioned insurance via CoverMyMeds Key/confirmation #/EOC [id number] Status is pending

## 2023-07-21 NOTE — Telephone Encounter (Signed)
 Hi Moria,                      Is there an update available?

## 2023-07-22 ENCOUNTER — Other Ambulatory Visit (HOSPITAL_COMMUNITY): Payer: Self-pay

## 2023-07-22 NOTE — Telephone Encounter (Addendum)
 I called patient and left message that prior auth was approved. I will call the pharmacy to make sure that it will be filled for patient.   I called [facility] and spoke with [facility] and patient's Rx is being dispensed.

## 2023-07-22 NOTE — Telephone Encounter (Signed)
 Pharmacy Patient Advocate Encounter  Received notification from [employer] that Prior Authorization for Wegovy  has been APPROVED from 07/21/23 to 01/20/24. Ran test claim, Copay is $24.99. This test claim was processed through [facility]- copay amounts may vary at other pharmacies due to pharmacy/plan contracts, or as the patient moves through the different stages of their insurance plan.   PA #/Case ID/Reference #: [id number]

## 2023-08-18 ENCOUNTER — Other Ambulatory Visit (HOSPITAL_COMMUNITY): Payer: Self-pay

## 2023-08-25 ENCOUNTER — Ambulatory Visit (INDEPENDENT_AMBULATORY_CARE_PROVIDER_SITE_OTHER): Admitting: Nurse Practitioner

## 2023-08-25 ENCOUNTER — Encounter: Payer: Self-pay | Admitting: Nurse Practitioner

## 2023-08-25 ENCOUNTER — Other Ambulatory Visit (HOSPITAL_COMMUNITY): Payer: Self-pay

## 2023-08-25 VITALS — BP 110/82 | HR 66 | Temp 96.8°F | Ht 68.5 in | Wt 189.0 lb

## 2023-08-25 DIAGNOSIS — R7303 Prediabetes: Secondary | ICD-10-CM

## 2023-08-25 DIAGNOSIS — Z0000 Encounter for general adult medical examination without abnormal findings: Secondary | ICD-10-CM | POA: Insufficient documentation

## 2023-08-25 DIAGNOSIS — E7800 Pure hypercholesterolemia, unspecified: Secondary | ICD-10-CM | POA: Diagnosis not present

## 2023-08-25 DIAGNOSIS — N644 Mastodynia: Secondary | ICD-10-CM

## 2023-08-25 DIAGNOSIS — E663 Overweight: Secondary | ICD-10-CM | POA: Diagnosis not present

## 2023-08-25 DIAGNOSIS — J4520 Mild intermittent asthma, uncomplicated: Secondary | ICD-10-CM

## 2023-08-25 MED ORDER — WEGOVY 0.5 MG/0.5ML ~~LOC~~ SOAJ
0.5000 mg | SUBCUTANEOUS | 1 refills | Status: DC
  Filled 2023-08-25: qty 2, 28d supply, fill #0
  Filled 2023-10-08: qty 2, 28d supply, fill #1

## 2023-08-25 MED ORDER — AIRSUPRA 90-80 MCG/ACT IN AERO
2.0000 | INHALATION_SPRAY | Freq: Four times a day (QID) | RESPIRATORY_TRACT | 2 refills | Status: AC | PRN
  Filled 2023-08-25: qty 10.7, 30d supply, fill #0

## 2023-08-25 NOTE — Assessment & Plan Note (Signed)
 BMI 28.3. She was doing well on the wegovy  until she increased to 1.7mg  dose where she experienced nausea and vomiting for a few days, then nausea for a few more days. Will have her decrease back down to 0.5mg  dose since she has been off it for 3 weeks. Continue focus on nutrition, exercise. Follow-up in 3 months.

## 2023-08-25 NOTE — Patient Instructions (Addendum)
 It was great to see you!  Start wegovy  0.5mg  injection weekly. Let me know if you would like to go up next month to 1mg   I have placed an order for mammogram, they should call you to schedule. If you do not hear from them in the next week, please call:  [facility] [address] [place], [address] [place], [place] [phone]  Let's follow-up in 3 months, sooner if you have concerns.  If a referral was placed today, you will be contacted for an appointment. Please note that routine referrals can sometimes take up to 3-4 weeks to process. Please call our office if you haven't heard anything after this time frame.  Take care,  Tinnie Harada, NP

## 2023-08-25 NOTE — Assessment & Plan Note (Signed)
Health maintenance reviewed and updated. Discussed nutrition, exercise. Follow-up 1 year.

## 2023-08-25 NOTE — Assessment & Plan Note (Signed)
 Chronic, stable. Will refill airsupra  inhaler 2 puffs every 6 hours as needed for shortness of breath or wheezing. Declined prevnar vaccine today. Follow-up with any concerns. Coupon card given for airsupra .

## 2023-08-25 NOTE — Progress Notes (Signed)
 BP 110/82 (BP Location: Right Arm, Patient Position: Sitting, Cuff Size: Normal)   Pulse 66   Temp (!) 96.8 F (36 C)   Ht 5' 8.5 (1.74 m)   Wt 189 lb (85.7 kg)   SpO2 100%   BMI 28.32 kg/m    Subjective:    Patient ID: Bianca Bass, female    DOB: [birth year], 53 y.o.   MRN: [id number]  CC: Chief Complaint  Patient presents with   Annual Exam    Discuss wegovy  and albuterol  inhaler    HPI: Bianca Bass is a 53 y.o. female presenting on 08/25/2023 for comprehensive medical examination. Current medical complaints include:nausea with wegovy   Discussed the use of AI scribe software for clinical note transcription with the patient, who gave verbal consent to proceed.  She experienced significant nausea after increasing her Wegovy  dose to 1.7 mg, with a sensation of impending vomiting. Symptoms persisted for about a week, with lingering effects for a few days after discontinuing the medication. She has not resumed Wegovy  since the episode three weeks ago.  She has been experiencing chest tightness and inflammation for the past one to two weeks, particularly around her breasts, described as tight and painful. No shortness of breath, wheezing, or recent falls. She misplaced her albuterol  inhaler and is not using any other inhalers currently.      Depression and Anxiety Screen done today and results listed below:     08/25/2023   10:54 AM 05/28/2023    9:22 AM 05/28/2023    8:47 AM 12/30/2022    2:14 PM 05/21/2021    1:25 PM  Depression screen PHQ 2/9  Decreased Interest 0 0 0 0 0  Down, Depressed, Hopeless 0 0 0 0 0  PHQ - 2 Score 0 0 0 0 0  Altered sleeping 0 0  0 0  Tired, decreased energy 0 0  0 0  Change in appetite 0 0  0 0  Feeling bad or failure about yourself  0 0  0 0  Trouble concentrating 0 0  0 0  Moving slowly or fidgety/restless 0 0  0 0  Suicidal thoughts 0 0  0 0  PHQ-9 Score 0 0  0 0  Difficult doing work/chores Not difficult at all Not  difficult at all   Not difficult at all      08/25/2023   10:54 AM 05/28/2023    9:22 AM 12/30/2022    2:14 PM 05/21/2021    1:25 PM  GAD 7 : Generalized Anxiety Score  Nervous, Anxious, on Edge 0 0 0 0  Control/stop worrying 0 0 0 0  Worry too much - different things 0 0 0 0  Trouble relaxing 0 0 0 0  Restless 0 0 0 0  Easily annoyed or irritable 0 0 0 0  Afraid - awful might happen 0 0 0 0  Total GAD 7 Score 0 0 0 0  Anxiety Difficulty Not difficult at all Not difficult at all  Not difficult at all    The patient does not have a history of falls. I did not complete a risk assessment for falls. A plan of care for falls was not documented.   Past Medical History:  Past Medical History:  Diagnosis Date   Complication of anesthesia    Fibroadenoma of breast, left    Fibroadenoma of left breast 05/17/2016   PONV (postoperative nausea and vomiting)     Surgical History:  Past Surgical  History:  Procedure Laterality Date   BREAST BIOPSY Left    3   BREAST BIOPSY Right    1   BREAST EXCISIONAL BIOPSY Left 05/17/2016   BREAST LUMPECTOMY WITH RADIOACTIVE SEED LOCALIZATION Left 05/17/2016   Procedure: LEFT BREAST LUMPECTOMY WITH RADIOACTIVE SEED LOCALIZATION;  Surgeon: Elon Pacini, MD;  Location: [facility];  Service: General;  Laterality: Left;   DILATION AND CURETTAGE OF UTERUS     ENDOMETRIAL ABLATION  2016   KNEE ARTHROSCOPY Bilateral    TUBAL LIGATION      Medications:  No current outpatient medications on file prior to visit.   No current facility-administered medications on file prior to visit.    Allergies:  No Known Allergies  Social History:  Social History   Socioeconomic History   Marital status: Married    Spouse name: Not on file   Number of children: Not on file   Years of education: Not on file   Highest education level: Not on file  Occupational History   Not on file  Tobacco Use   Smoking status: Never    Passive exposure:  Never   Smokeless tobacco: Never  Vaping Use   Vaping status: Never Used  Substance and Sexual Activity   Alcohol use: No   Drug use: No   Sexual activity: Not on file    Comment: endometrial ablation  Other Topics Concern   Not on file  Social History Narrative   Not on file   Social Drivers of Health   Financial Resource Strain: Not on file  Food Insecurity: Not on file  Transportation Needs: Not on file  Physical Activity: Not on file  Stress: Not on file  Social Connections: Not on file  Intimate Partner Violence: Not on file   Social History   Tobacco Use  Smoking Status Never   Passive exposure: Never  Smokeless Tobacco Never   Social History   Substance and Sexual Activity  Alcohol Use No    Family History:  Family History  Problem Relation Age of Onset   Diabetes Mother    Multiple sclerosis Mother    Diabetes Father    Breast cancer Neg Hx    Colon cancer Neg Hx    Colon polyps Neg Hx    Crohn's disease Neg Hx    Esophageal cancer Neg Hx    Rectal cancer Neg Hx    Stomach cancer Neg Hx    Ulcerative colitis Neg Hx     Past medical history, surgical history, medications, allergies, family history and social history reviewed with patient today and changes made to appropriate areas of the chart.   Review of Systems  Constitutional: Negative.   HENT: Negative.    Eyes: Negative.   Respiratory:  Negative for cough, shortness of breath and wheezing.        Chest tightness  Cardiovascular: Negative.   Gastrointestinal: Negative.   Genitourinary: Negative.   Musculoskeletal:        Right breast pain  Skin: Negative.   Neurological: Negative.   Psychiatric/Behavioral: Negative.     All other ROS negative except what is listed above and in the HPI.      Objective:    BP 110/82 (BP Location: Right Arm, Patient Position: Sitting, Cuff Size: Normal)   Pulse 66   Temp (!) 96.8 F (36 C)   Ht 5' 8.5 (1.74 m)   Wt 189 lb (85.7 kg)   SpO2 100%  BMI 28.32 kg/m   Wt Readings from Last 3 Encounters:  08/25/23 189 lb (85.7 kg)  05/28/23 197 lb 3.2 oz (89.4 kg)  12/30/22 212 lb 12.8 oz (96.5 kg)    Physical Exam Vitals and nursing note reviewed.  Constitutional:      General: She is not in acute distress.    Appearance: Normal appearance.  HENT:     Head: Normocephalic and atraumatic.     Right Ear: Tympanic membrane, ear canal and external ear normal.     Left Ear: Tympanic membrane, ear canal and external ear normal.     Mouth/Throat:     Mouth: Mucous membranes are moist.     Pharynx: No posterior oropharyngeal erythema.  Eyes:     Conjunctiva/sclera: Conjunctivae normal.  Cardiovascular:     Rate and Rhythm: Normal rate and regular rhythm.     Pulses: Normal pulses.     Heart sounds: Normal heart sounds.  Pulmonary:     Effort: Pulmonary effort is normal.     Breath sounds: Normal breath sounds.  Chest:  Breasts:    Right: Tenderness present. No swelling, bleeding or inverted nipple.     Left: No swelling, bleeding, inverted nipple or tenderness.  Abdominal:     Palpations: Abdomen is soft.     Tenderness: There is no abdominal tenderness.  Musculoskeletal:        General: Normal range of motion.     Cervical back: Normal range of motion and neck supple.     Right lower leg: No edema.     Left lower leg: No edema.  Lymphadenopathy:     Cervical: No cervical adenopathy.     Upper Body:     Right upper body: No supraclavicular or axillary adenopathy.     Left upper body: No supraclavicular, axillary or pectoral adenopathy.  Skin:    General: Skin is warm and dry.  Neurological:     General: No focal deficit present.     Mental Status: She is alert and oriented to person, place, and time.     Cranial Nerves: No cranial nerve deficit.     Coordination: Coordination normal.     Gait: Gait normal.  Psychiatric:        Mood and Affect: Mood normal.        Behavior: Behavior normal.        Thought Content:  Thought content normal.        Judgment: Judgment normal.     Results for orders placed or performed in visit on 06/11/23  Dermatology pathology   Collection Time: 06/11/23 12:00 AM  Result Value Ref Range   DERMATOLOGY PATHOLOGY      DERMATOLOGY PATHOLOGY [facility] [address] [place], [place] [address] Telephone [phone] or [phone] Fax [phone]  REPORT OF DERMATOPATHOLOGY   Accession #: [id number] Patient Name: LUANA, TATRO Visit # :   MRN: [id number] Cytotechnologist: Munoz-Bishop Md, Eleanor, Dermatopathologist, Electronic Signature DOB/Age October 17, 1970 (Age: 60) Gender: F Collected Date: 06/11/2023 Received Date: 06/11/2023  FINAL DIAGNOSIS       1. Skin (M), right shoulder :       EPIDERMOID CYST, INFLAMED AND DISRUPTED       DATE SIGNED OUT: 06/13/2023 ELECTRONIC SIGNATURE : Munoz-Bishop Md, Melissa, Dermatopathologist, Electronic Signature  MICROSCOPIC DESCRIPTION 1. There is free keratin in the dermis associated with an inflammatory infiltrate composed of lymphocytes, histiocytes and neutrophils.  Granulation tissue is observed.  A  partial squamous epithelial lining is present.   There is no atypia.  These findings are tho se of a ruptured epidermoid cyst.  CASE COMMENTS STAINS USED IN DIAGNOSIS: H&E H&E H&E H&E    CLINICAL HISTORY  SPECIMEN(S) OBTAINED 1. Skin (M), Right Shoulder  SPECIMEN COMMENTS: SPECIMEN CLINICAL INFORMATION: 1. Sebaceous cyst    Gross Description 1. Shape:  Includes limited fat, elliptical      Size:  19 x 15 x 13 mm in aggregate      Lesion:  Clinically mentioned cyst observed      Orientation:  None; all margins inked      Block Summary: Specimen has been submitted in representative sections with 3      cross sections in 3 blocks labeled 1A, 1B, and 1C and 4 cross sections in 1      block labeled 1D.      4 Block(s) submitted. (nn)        Report  signed out from the following location(s) [place] [address] [place], [place] [address] CLIA #: [id number]  [facility] [address] [place], [place] [address] CLIA #: [id number]       Assessment & Plan:   Problem List Items Addressed This Visit       Respiratory   Mild intermittent asthma without complication   Chronic, stable. Will refill airsupra  inhaler 2 puffs every 6 hours as needed for shortness of breath or wheezing. Declined prevnar vaccine today. Follow-up with any concerns. Coupon card given for airsupra .      Relevant Medications   Albuterol -Budesonide  (AIRSUPRA ) 90-80 MCG/ACT AERO     Other   Pure hypercholesterolemia   Chronic, stable. Continue focus on nutrition and exercise. She will get labs rechecked through her work next month and send the results.       Prediabetes   Chronic, stable. Continue focus on nutrition and exercise. She will get labs rechecked through her work next month and send the results.       Overweight   BMI 28.3. She was doing well on the wegovy  until she increased to 1.7mg  dose where she experienced nausea and vomiting for a few days, then nausea for a few more days. Will have her decrease back down to 0.5mg  dose since she has been off it for 3 weeks. Continue focus on nutrition, exercise. Follow-up in 3 months.       Routine general medical examination at a health care facility - Primary   Health maintenance reviewed and updated. Discussed nutrition, exercise. Follow-up 1 year.        Other Visit Diagnoses       Breast pain, right       Relevant Orders   MM 3D DIAGNOSTIC MAMMOGRAM BILATERAL BREAST   US  LIMITED ULTRASOUND INCLUDING AXILLA RIGHT BREAST        Follow up plan: Return in about 3 months (around 11/25/2023) for weight management .   LABORATORY TESTING:  - Pap smear: done elsewhere  IMMUNIZATIONS:   - Tdap: Tetanus vaccination status reviewed: declined. - Influenza:  Postponed to flu season - Pneumovax: Not applicable - Prevnar: Declined - HPV: Not applicable - Shingrix vaccine: Declined  SCREENING: -Mammogram: Up to date  - Colonoscopy: Up to date  - Bone Density: Not applicable   PATIENT COUNSELING:   Advised to take 1 mg of folate supplement per day if capable of pregnancy.   Sexuality: Discussed sexually transmitted diseases, partner selection,  use of condoms, avoidance of unintended pregnancy  and contraceptive alternatives.   Advised to avoid cigarette smoking.  I discussed with the patient that most people either abstain from alcohol or drink within safe limits (<=14/week and <=4 drinks/occasion for males, <=7/weeks and <= 3 drinks/occasion for females) and that the risk for alcohol disorders and other health effects rises proportionally with the number of drinks per week and how often a drinker exceeds daily limits.  Discussed cessation/primary prevention of drug use and availability of treatment for abuse.   Diet: Encouraged to adjust caloric intake to maintain  or achieve ideal body weight, to reduce intake of dietary saturated fat and total fat, to limit sodium intake by avoiding high sodium foods and not adding table salt, and to maintain adequate dietary potassium and calcium preferably from fresh fruits, vegetables, and low-fat dairy products.    stressed the importance of regular exercise  Injury prevention: Discussed safety belts, safety helmets, smoke detector, smoking near bedding or upholstery.   Dental health: Discussed importance of regular tooth brushing, flossing, and dental visits.    NEXT PREVENTATIVE PHYSICAL DUE IN 1 YEAR. Return in about 3 months (around 11/25/2023) for weight management .  Casy Brunetto A Danyetta Gillham

## 2023-08-25 NOTE — Assessment & Plan Note (Signed)
 Chronic, stable. Continue focus on nutrition and exercise. She will get labs rechecked through her work next month and send the results.

## 2023-09-03 ENCOUNTER — Ambulatory Visit
Admission: RE | Admit: 2023-09-03 | Discharge: 2023-09-03 | Disposition: A | Discharge status: Home / Self Care | Source: Ambulatory Visit | Attending: Nurse Practitioner | Admitting: Nurse Practitioner

## 2023-09-03 ENCOUNTER — Ambulatory Visit

## 2023-09-03 ENCOUNTER — Ambulatory Visit: Payer: Self-pay | Admitting: Nurse Practitioner

## 2023-09-03 DIAGNOSIS — N644 Mastodynia: Secondary | ICD-10-CM

## 2023-09-08 LAB — BASIC METABOLIC PANEL WITH GFR: Glucose: 80

## 2023-09-08 LAB — LIPID PANEL
Cholesterol: 185 (ref 0–200)
HDL: 57 (ref 35–70)
LDL Cholesterol: 112
Triglycerides: 87 (ref 40–160)

## 2023-09-08 LAB — HEMOGLOBIN A1C: Hemoglobin A1C: 5.5

## 2023-10-08 ENCOUNTER — Other Ambulatory Visit (HOSPITAL_COMMUNITY): Payer: Self-pay

## 2023-11-07 ENCOUNTER — Other Ambulatory Visit (HOSPITAL_COMMUNITY): Payer: Self-pay

## 2023-11-07 ENCOUNTER — Other Ambulatory Visit: Payer: Self-pay | Admitting: Nurse Practitioner

## 2023-11-10 ENCOUNTER — Other Ambulatory Visit (HOSPITAL_COMMUNITY): Payer: Self-pay

## 2023-11-10 MED ORDER — WEGOVY 0.5 MG/0.5ML ~~LOC~~ SOAJ
0.5000 mg | SUBCUTANEOUS | 1 refills | Status: DC
  Filled 2023-11-10: qty 2, 28d supply, fill #0
  Filled 2023-12-05: qty 2, 28d supply, fill #1

## 2023-11-10 NOTE — Telephone Encounter (Signed)
 Requesting:   semaglutide -weight management (WEGOVY ) 0.5 MG/0.5ML SOAJ SQ injection  Last Visit: 08/25/2023 Next Visit: 11/24/2023 Last Refill: 08/25/2023  Please Advise

## 2023-11-11 ENCOUNTER — Other Ambulatory Visit (HOSPITAL_COMMUNITY): Payer: Self-pay

## 2023-11-24 ENCOUNTER — Ambulatory Visit: Admitting: Nurse Practitioner

## 2023-11-24 ENCOUNTER — Encounter: Payer: Self-pay | Admitting: Nurse Practitioner

## 2023-11-24 VITALS — BP 128/78 | HR 56 | Temp 97.1°F | Ht 68.5 in | Wt 185.4 lb

## 2023-11-24 DIAGNOSIS — M25561 Pain in right knee: Secondary | ICD-10-CM

## 2023-11-24 DIAGNOSIS — E7800 Pure hypercholesterolemia, unspecified: Secondary | ICD-10-CM

## 2023-11-24 DIAGNOSIS — G8929 Other chronic pain: Secondary | ICD-10-CM | POA: Diagnosis not present

## 2023-11-24 DIAGNOSIS — E663 Overweight: Secondary | ICD-10-CM | POA: Diagnosis not present

## 2023-11-24 NOTE — Patient Instructions (Signed)
It was great to see you!  Keep up the great work!   Let's follow-up in 3 months, sooner if you have concerns.  If a referral was placed today, you will be contacted for an appointment. Please note that routine referrals can sometimes take up to 3-4 weeks to process. Please call our office if you haven't heard anything after this time frame.  Take care,  Gordana Kewley, NP  

## 2023-11-24 NOTE — Progress Notes (Signed)
 Established Patient Office Visit  Subjective   Patient ID: Bianca Bass, female    DOB: 24-Jul-1970  Age: 53 y.o. MRN: [id number]  Chief Complaint  Patient presents with   Weight Managment    Follow up, no concerns     HPI Discussed the use of AI scribe software for clinical note transcription with the patient, who gave verbal consent to proceed.  History of Present Illness   Bianca Bass is a 53 year old female who presents for weight management and follow-up on cholesterol levels.  She is on 0.5 mg of Wegovy  for weight management and has lost four pounds since her last visit. She previously experienced side effects on a higher dose of 1.7 mg but tolerates the 0.5 mg dose well. Her goal is to reach a weight of 175-180 pounds, with a current BMI of 27.7.  Her right knee inflammation has resolved following a cortisone shot, and she has resumed exercising.  Recent lab results show total cholesterol within normal limits, with LDL cholesterol slightly elevated at 112 mg/dL, down from 884 mg/dL last year. Triglycerides have improved.  No chest pain or shortness of breath. Kidney function is normal. She plans to schedule a Pap smear and regular mammogram, noting a busy schedule in the upcoming months.       ROS See pertinent positives and negatives per HPI.    Objective:     BP 128/78 (BP Location: Left Arm, Patient Position: Sitting, Cuff Size: Normal)   Pulse (!) 56   Temp (!) 97.1 F (36.2 C)   Ht 5' 8.5 (1.74 m)   Wt 185 lb 6.4 oz (84.1 kg)   SpO2 100%   BMI 27.78 kg/m  BP Readings from Last 3 Encounters:  11/24/23 128/78  08/25/23 110/82  05/28/23 106/68   Wt Readings from Last 3 Encounters:  11/24/23 185 lb 6.4 oz (84.1 kg)  08/25/23 189 lb (85.7 kg)  05/28/23 197 lb 3.2 oz (89.4 kg)      Physical Exam Vitals and nursing note reviewed.  Constitutional:      General: She is not in acute distress.    Appearance: Normal appearance.   HENT:     Head: Normocephalic.  Eyes:     Conjunctiva/sclera: Conjunctivae normal.  Cardiovascular:     Rate and Rhythm: Normal rate and regular rhythm.     Pulses: Normal pulses.     Heart sounds: Normal heart sounds.  Pulmonary:     Effort: Pulmonary effort is normal.     Breath sounds: Normal breath sounds.  Musculoskeletal:     Cervical back: Normal range of motion.  Skin:    General: Skin is warm.  Neurological:     General: No focal deficit present.     Mental Status: She is alert and oriented to person, place, and time.  Psychiatric:        Mood and Affect: Mood normal.        Behavior: Behavior normal.        Thought Content: Thought content normal.        Judgment: Judgment normal.    The 10-year ASCVD risk score (Arnett DK, et al., 2019) is: 2.9%* (Cholesterol units were assumed)    Assessment & Plan:   Problem List Items Addressed This Visit       Other   Pure hypercholesterolemia - Primary   Chronic, stable. LDL is 112 mg/dL, with a goal of less than 100 mg/dL. Total cholesterol  is normal, showing improvement from last year. Continue lifestyle modifications.   The 10-year ASCVD risk score (Arnett DK, et al., 2019) is: 2.9%*   Values used to calculate the score:     Age: 1 years     Clincally relevant sex: Female     Is Non-Hispanic African American: Yes     Diabetic: No     Tobacco smoker: No     Systolic Blood Pressure: 128 mmHg     Is BP treated: No     HDL Cholesterol: 57 mg/dL*     Total Cholesterol: 185 mg/dL*     * - Cholesterol units were assumed for this score calculation      Overweight   She is on Wegovy  0.5 mg and has lost 4 pounds. Her BMI is 27.7, with a goal weight of 175-180 pounds. Continue Wegovy  0.5 mg and increase to 1 mg after the current supply. Encourage continued exercise and weight management. Follow-up in 3 months.      Chronic pain of right knee   Chronic, stable. Her knee inflammation has improved after a cortisone  injection, allowing her to resume exercise.       Return in about 3 months (around 02/24/2024) for weight management .    Tinnie DELENA Harada, NP

## 2023-11-24 NOTE — Assessment & Plan Note (Signed)
 Chronic, stable. LDL is 112 mg/dL, with a goal of less than 100 mg/dL. Total cholesterol is normal, showing improvement from last year. Continue lifestyle modifications.   The 10-year ASCVD risk score (Arnett DK, et al., 2019) is: 2.9%*   Values used to calculate the score:     Age: 53 years     Clincally relevant sex: Female     Is Non-Hispanic African American: Yes     Diabetic: No     Tobacco smoker: No     Systolic Blood Pressure: 128 mmHg     Is BP treated: No     HDL Cholesterol: 57 mg/dL*     Total Cholesterol: 185 mg/dL*     * - Cholesterol units were assumed for this score calculation

## 2023-11-24 NOTE — Assessment & Plan Note (Signed)
 Chronic, stable. Her knee inflammation has improved after a cortisone injection, allowing her to resume exercise.

## 2023-11-24 NOTE — Assessment & Plan Note (Signed)
 She is on Wegovy  0.5 mg and has lost 4 pounds. Her BMI is 27.7, with a goal weight of 175-180 pounds. Continue Wegovy  0.5 mg and increase to 1 mg after the current supply. Encourage continued exercise and weight management. Follow-up in 3 months.

## 2023-12-05 ENCOUNTER — Other Ambulatory Visit (HOSPITAL_COMMUNITY): Payer: Self-pay

## 2023-12-29 ENCOUNTER — Telehealth: Payer: Self-pay | Admitting: Nurse Practitioner

## 2023-12-29 NOTE — Telephone Encounter (Signed)
 Copied from CRM #[id number]. Topic: Clinical - Medication Refill >> Dec 29, 2023  1:18 PM Ashley R wrote: Medication: per previous conversation, pt is ready to increase to next dose up :   semaglutide -weight management (WEGOVY ) 0.5 MG/0.5ML SOAJ SQ injection -    Has the patient contacted their pharmacy? Yes   This is the patient's preferred pharmacy:  [facility] [address] [place] [address] Phone: [phone] Fax: [phone]  Is this the correct pharmacy for this prescription? Yes If no, delete pharmacy and type the correct one.   Has the prescription been filled recently? Yes  Is the patient out of the medication? Yes  Has the patient been seen for an appointment in the last year OR does the patient have an upcoming appointment? Yes  Can we respond through MyChart? Yes  Agent: Please be advised that Rx refills may take up to 3 business days. We ask that you follow-up with your pharmacy.

## 2023-12-30 NOTE — Telephone Encounter (Signed)
 Pt requesting dosage increase.

## 2023-12-31 ENCOUNTER — Other Ambulatory Visit (HOSPITAL_COMMUNITY): Payer: Self-pay

## 2023-12-31 MED ORDER — WEGOVY 1 MG/0.5ML ~~LOC~~ SOAJ
1.0000 mg | SUBCUTANEOUS | 0 refills | Status: AC
  Filled 2023-12-31: qty 2, 28d supply, fill #0

## 2023-12-31 NOTE — Telephone Encounter (Signed)
 Requesting: semaglutide -weight management (WEGOVY ) 0.5 MG/0.5ML SOAJ SQ injection -  REQUESTING AN INCREASE  Last Visit: 11/24/2023 Next Visit: Visit date not found Last Refill: 11/10/2023  Please Advise

## 2024-01-02 NOTE — Telephone Encounter (Signed)
 I called and left patient a message that medication has been refilled and to call the office to make a follow up appointment.
